# Patient Record
Sex: Male | Born: 2000 | Race: Black or African American | Hispanic: No | Marital: Single | Smoking: Never smoker
Health system: Southern US, Community
[De-identification: ages and names within clinical notes are randomized; demographics above are authoritative.]

## PROBLEM LIST (undated history)

## (undated) DIAGNOSIS — R12 Heartburn: Secondary | ICD-10-CM

## (undated) HISTORY — DX: Heartburn: R12

---
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
## 2019-05-26 ENCOUNTER — Emergency Department
Admission: EM | Admit: 2019-05-26 | Discharge: 2019-05-26 | Disposition: A | Discharge status: Home / Self Care | Payer: Medicaid Other | Attending: Emergency Medicine | Admitting: Emergency Medicine

## 2019-05-26 ENCOUNTER — Other Ambulatory Visit: Payer: Self-pay

## 2019-05-26 ENCOUNTER — Encounter: Payer: Self-pay | Admitting: Emergency Medicine

## 2019-05-26 DIAGNOSIS — J029 Acute pharyngitis, unspecified: Secondary | ICD-10-CM

## 2019-05-26 DIAGNOSIS — J020 Streptococcal pharyngitis: Secondary | ICD-10-CM | POA: Diagnosis not present

## 2019-05-26 LAB — GROUP A STREP BY PCR: Group A Strep by PCR: NOT DETECTED

## 2019-05-26 MED ORDER — LIDOCAINE HCL (PF) 1 % IJ SOLN
5.0000 mL | Freq: Once | INTRAMUSCULAR | Status: DC
  Filled 2019-05-26: qty 5

## 2019-05-26 MED ORDER — AMOXICILLIN 500 MG PO CAPS: 500.0000 mg | ORAL_CAPSULE | Freq: Three times a day (TID) | ORAL | 0 refills | Status: DC

## 2019-05-26 MED ORDER — LIDOCAINE VISCOUS HCL 2 % MT SOLN
15.0000 mL | Freq: Once | OROMUCOSAL | Status: AC
  Administered 2019-05-26: 15:00:00 15 mL via OROMUCOSAL
  Filled 2019-05-26: qty 15

## 2019-05-26 MED ORDER — AMOXICILLIN 500 MG PO CAPS
500.0000 mg | ORAL_CAPSULE | Freq: Once | ORAL | Status: AC
  Administered 2019-05-26: 16:00:00 500 mg via ORAL
  Filled 2019-05-26: qty 1

## 2019-05-26 NOTE — ED Notes (Signed)

## 2019-05-26 NOTE — Discharge Instructions (Addendum)
You are being treated for strep throat. Take the prescription antibiotic as directed. Continue to take OTC Tylenol and Motrin as needed. Rest, hydrate, and change your toothbrush in 24 hours.

## 2019-05-26 NOTE — ED Triage Notes (Signed)
Patient reports sore throat and head congestion x2 days.

## 2019-05-26 NOTE — ED Provider Notes (Signed)
[facility] Emergency Department Provider Note ____________________________________________  Time seen: 1435  I have reviewed the triage vital signs and the nursing notes.  HISTORY  Chief Complaint  Sore Throat and Nasal Congestion  HPI Todd Oneill is a 18 y.o. male presents to the ED for evaluation of sudden onset of sore throat. He reports chills and subjective fevers. He reports pain with swallowing. He denies nausea, vomiting, or rash.    History reviewed. No pertinent past medical history.  There are no active problems to display for this patient.  History reviewed. No pertinent surgical history.  Prior to Admission medications   Medication Sig Start Date End Date Taking? Authorizing Provider  amoxicillin (AMOXIL) 500 MG capsule Take 1 capsule (500 mg total) by mouth 3 (three) times daily. 05/26/19   Eddison Searls, Charlesetta IvoryJenise V Bacon, PA-C    Allergies Patient has no known allergies.  No family history on file.  Social History Social History   Tobacco Use  . Smoking status: Never Smoker  . Smokeless tobacco: Never Used  Substance Use Topics  . Alcohol use: Never    Frequency: Never  . Drug use: Never    Review of Systems  Constitutional: Negative for fever. Eyes: Negative for visual changes. ENT: Positive for sore throat. Cardiovascular: Negative for chest pain. Respiratory: Negative for shortness of breath. Gastrointestinal: Negative for abdominal pain, vomiting and diarrhea. Genitourinary: Negative for dysuria. Musculoskeletal: Negative for back pain. Skin: Negative for rash. Neurological: Negative for headaches, focal weakness or numbness. ____________________________________________  PHYSICAL EXAM:  VITAL SIGNS: ED Triage Vitals  Enc Vitals Group     BP 05/26/19 1324 140/85     Pulse Rate 05/26/19 1324 83     Resp 05/26/19 1324 16     Temp 05/26/19 1324 100 F (37.8 C)     Temp Source 05/26/19 1324 Oral     SpO2 05/26/19 1324  100 %     Weight 05/26/19 1325 130 lb (59 kg)     Height --      Head Circumference --      Peak Flow --      Pain Score 05/26/19 1325 8     Pain Loc --      Pain Edu? --      Excl. in GC? --     Constitutional: Alert and oriented. Well appearing and in no distress. Head: Normocephalic and atraumatic. Eyes: Conjunctivae are normal. Normal extraocular movements Mouth/Throat: Mucous membranes are moist. Uvula is midline and tonsils are mildly injected.  Neck: Supple. No thyromegaly. Hematological/Lymphatic/Immunological: Palpable, tender anterior cervical lymphadenopathy. Cardiovascular: Normal rate, regular rhythm. Normal distal pulses. Respiratory: Normal respiratory effort. No wheezes/rales/rhonchi. Gastrointestinal: Soft and nontender. No distention. Musculoskeletal: Nontender with normal range of motion in all extremities.  Neurologic:  Normal gait without ataxia. Normal speech and language. No gross focal neurologic deficits are appreciated. Skin:  Skin is warm, dry and intact. No rash noted. ____________________________________________   LABS (pertinent positives/negatives) Labs Reviewed  GROUP A STREP BY PCR  ____________________________________________  PROCEDURES  Viscous lido 2% gargle Amoxicillin 500 mg PO Procedures ____________________________________________  INITIAL IMPRESSION / ASSESSMENT AND PLAN / ED COURSE  Todd Oneill to the ED for evaluation of sudden sore throat pain.  Patient presents with clinical picture that is concerning for strep pharyngitis.  He has a elevated temp at 100 F.  He also has palpable anterior lymph nodes.  Patient be treated empirically for strep pharyngitis despite his negative rapid test.  A  prescription for amoxicillin is provided at this time.  He will follow with primary provider return to the ED as needed.  Todd MATSUO was evaluated in Emergency Department on 05/26/2019 for the symptoms described in the history of present illness.  He was evaluated in the context of the global COVID-19 pandemic, which necessitated consideration that the patient might be at risk for infection with the SARS-CoV-2 virus that causes COVID-19. Institutional protocols and algorithms that pertain to the evaluation of patients at risk for COVID-19 are in a state of rapid change based on information released by regulatory bodies including the CDC and federal and state organizations. These policies and algorithms were followed during the patient's care in the ED. ____________________________________________  FINAL CLINICAL IMPRESSION(S) / ED DIAGNOSES  Final diagnoses:  Strep pharyngitis  Sore throat      Todd Oneill, Todd Karvonen, PA-C 05/26/19 1702    Lavonia Drafts, MD 05/27/19 1008

## 2019-07-15 ENCOUNTER — Other Ambulatory Visit: Payer: Self-pay

## 2019-07-15 ENCOUNTER — Encounter: Payer: Self-pay | Admitting: Family Medicine

## 2019-07-15 ENCOUNTER — Ambulatory Visit: Payer: Medicaid Other | Admitting: Family Medicine

## 2019-07-15 DIAGNOSIS — Z202 Contact with and (suspected) exposure to infections with a predominantly sexual mode of transmission: Secondary | ICD-10-CM | POA: Diagnosis not present

## 2019-07-15 DIAGNOSIS — N341 Nonspecific urethritis: Secondary | ICD-10-CM | POA: Diagnosis not present

## 2019-07-15 DIAGNOSIS — R4586 Emotional lability: Secondary | ICD-10-CM

## 2019-07-15 DIAGNOSIS — Z113 Encounter for screening for infections with a predominantly sexual mode of transmission: Secondary | ICD-10-CM

## 2019-07-15 DIAGNOSIS — Z7252 High risk homosexual behavior: Secondary | ICD-10-CM

## 2019-07-15 LAB — GRAM STAIN

## 2019-07-15 MED ORDER — PENICILLIN G BENZATHINE 2400000 UNIT/4ML IM SUSP
2.4000 10*6.[IU] | Freq: Once | INTRAMUSCULAR | Status: AC
  Administered 2019-07-15: 2400000 [IU] via INTRAMUSCULAR

## 2019-07-15 MED ORDER — AZITHROMYCIN 500 MG PO TABS
1000.0000 mg | ORAL_TABLET | Freq: Once | ORAL | Status: AC
  Administered 2019-07-15: 1000 mg via ORAL

## 2019-07-15 NOTE — Progress Notes (Signed)
Bicillin 2.81mu given in 2 divided doses. Bicillin 1.62mu IM given in right gluteal by this RN. Pt tolerated well.Ronny Bacon, RN

## 2019-07-15 NOTE — Progress Notes (Signed)
STI clinic/screening visit  Subjective:  Todd Oneill is a 18 y.o. male being seen today for an STI screening visit. The patient reports they do not have symptoms.  Patient has the following medical conditions:  There are no active problems to display for this patient.    Chief Complaint  Patient presents with  . Exposure to STD    HPI  Patient reports contact to syphilis   See flowsheet for further details and programmatic requirements.    The following portions of the patient's history were reviewed and updated as appropriate: allergies, current medications, past medical history, past social history, past surgical history and problem list.  Objective:  There were no vitals filed for this visit.  Physical Exam Constitutional:      Appearance: Normal appearance.  HENT:     Head: Normocephalic and atraumatic.     Comments: No nits or hair loss    Mouth/Throat:     Mouth: Mucous membranes are moist.     Pharynx: Oropharynx is clear. No oropharyngeal exudate or posterior oropharyngeal erythema.  Pulmonary:     Effort: Pulmonary effort is normal.  Abdominal:     General: Abdomen is flat.     Palpations: Abdomen is soft. There is no hepatomegaly or mass.     Tenderness: There is no abdominal tenderness.  Genitourinary:    Pubic Area: No rash or pubic lice.      Penis: Normal.      Scrotum/Testes: Normal.     Epididymis:     Right: Normal.     Left: Normal.     Rectum: Normal.  Lymphadenopathy:     Head:     Right side of head: No preauricular or posterior auricular adenopathy.     Left side of head: No preauricular or posterior auricular adenopathy.     Cervical: No cervical adenopathy.     Upper Body:     Right upper body: No supraclavicular or axillary adenopathy.     Left upper body: No supraclavicular or axillary adenopathy.     Lower Body: No right inguinal adenopathy. No left inguinal adenopathy.  Skin:    General: Skin is warm and dry.     Findings: No  rash.  Neurological:     Mental Status: He is alert and oriented to person, place, and time.       Assessment and Plan:  Todd Oneill is a 18 y.o. male presenting to the [facility] Department for STI screening  1. Screening examination for venereal disease Treat gram stain per standing Accepts all screenings today - Gram stain - HIV/HCV [place] [facility] - HBV Antigen/Antibody [place] [facility] - Chlamydia/Gonorrhea [place] [facility] - Syphilis Serology, [place] [facility] - [place] [facility]  2. High risk homosexual behavior - HIV/HCV [place] [facility] - HBV Antigen/Antibody [place] [facility] - Chlamydia/Gonorrhea [place] [facility] - Syphilis Serology, [place] [facility] - [place] [facility]  3. Syphilis contact Partner with report to syphilis. Will treat today and await results. Will consider late latent as patient had never testing prior.  - Penicillin G Benzathine SUSP 2,400,000 Units  Return in 1 week (on 07/22/2019) for PCN.  Future Appointments  Date Time Provider [place]  07/22/2019  2:00 PM [facility] NURSE [facility] None    Caren Macadam, MD

## 2019-07-15 NOTE — Progress Notes (Signed)
Gram stain reviewed. Tx'd for NGU per SO.  Patient also tx'd for contact to syphilis. Aileen Fass, RN

## 2019-07-22 ENCOUNTER — Other Ambulatory Visit: Payer: Self-pay

## 2019-07-22 ENCOUNTER — Ambulatory Visit: Payer: Self-pay

## 2019-07-22 DIAGNOSIS — Z719 Counseling, unspecified: Secondary | ICD-10-CM

## 2019-07-22 NOTE — Progress Notes (Signed)
Client presents to Nurse Clinic to ascertain if additional Bicillin is needed. Evaluated 07/15/2019 in [facility] STD clinic and treated with Bicillin as contact to syphilis. As lab reports not yet received from [facility], consult with Antoine Primas PA. She reviewed labs on web site (no positives indicating need for treatment). As syphilis is negative and client treated last week, no additional Bicillin is needed per Ms Lazarus Salines.. Client counseled regarding above. Rich Number, RN

## 2019-07-23 LAB — HEPATITIS B SURFACE ANTIGEN

## 2019-07-24 LAB — HM HIV SCREENING LAB: HM HIV Screening: NEGATIVE

## 2019-07-24 LAB — HM HEPATITIS C SCREENING LAB: HM Hepatitis Screen: NEGATIVE

## 2019-11-24 ENCOUNTER — Other Ambulatory Visit: Payer: Self-pay

## 2019-11-24 ENCOUNTER — Emergency Department
Admission: EM | Admit: 2019-11-24 | Discharge: 2019-11-24 | Disposition: A | Discharge status: Home / Self Care | Payer: Medicaid Other | Attending: Emergency Medicine | Admitting: Emergency Medicine

## 2019-11-24 ENCOUNTER — Encounter: Payer: Self-pay | Admitting: Emergency Medicine

## 2019-11-24 DIAGNOSIS — M7918 Myalgia, other site: Secondary | ICD-10-CM | POA: Insufficient documentation

## 2019-11-24 DIAGNOSIS — R109 Unspecified abdominal pain: Secondary | ICD-10-CM | POA: Insufficient documentation

## 2019-11-24 DIAGNOSIS — R509 Fever, unspecified: Secondary | ICD-10-CM | POA: Insufficient documentation

## 2019-11-24 DIAGNOSIS — J020 Streptococcal pharyngitis: Secondary | ICD-10-CM | POA: Insufficient documentation

## 2019-11-24 DIAGNOSIS — R070 Pain in throat: Secondary | ICD-10-CM | POA: Diagnosis present

## 2019-11-24 MED ORDER — AMOXICILLIN-POT CLAVULANATE 875-125 MG PO TABS: 1.0000 | ORAL_TABLET | Freq: Two times a day (BID) | ORAL | 0 refills | Status: AC

## 2019-11-24 NOTE — Discharge Instructions (Addendum)
Please take antibiotics as prescribed and make sure you are drinking lots of fluids. Please alternate tylenol and ibu as needed for fevers. Return to the ED for any difficulty swallowing, increased pain or for any urgent changes in your health

## 2019-11-24 NOTE — ED Triage Notes (Signed)
Pt in via POV, reports waking up this morning with sore throat, chills, some nausea.  Febrile upon arrival, other vitals WDL.  Ambulatory to triage, NAD noted at this time.

## 2019-11-24 NOTE — ED Provider Notes (Signed)
[facility] EMERGENCY DEPARTMENT Provider Note   CSN: [id number] Arrival date & time: 11/24/19  1624     History Chief Complaint  Patient presents with  . Fever  . Sore Throat    Todd Oneill is a 19 y.o. male presents to the emergency department for evaluation of sore throat.  Patient developed sore throat yesterday, mostly on the left side.  Has pain with swallowing.  Pain on a 10.  Has not had any medications such as Tylenol or ibuprofen.  He denies any nausea vomiting diarrhea or rashes.  Has had some mild abdominal pain and body aches.  Temperature upon arrival to ED 100.5.  Tolerating fluids well.  HPI     History reviewed. No pertinent past medical history.  There are no problems to display for this patient.   History reviewed. No pertinent surgical history.     No family history on file.  Social History   Tobacco Use  . Smoking status: Never Smoker  . Smokeless tobacco: Never Used  Substance Use Topics  . Alcohol use: Never  . Drug use: Never    Home Medications Prior to Admission medications   Medication Sig Start Date End Date Taking? Authorizing Provider  amoxicillin-clavulanate (AUGMENTIN) 875-125 MG tablet Take 1 tablet by mouth every 12 (twelve) hours for 10 days. 11/24/19 12/04/19  Evon Slack, PA-C    Allergies    Patient has no known allergies.  Review of Systems   Review of Systems  Constitutional: Positive for chills and fever.  HENT: Positive for sore throat.   Respiratory: Negative for shortness of breath.   Cardiovascular: Negative for chest pain.  Gastrointestinal: Negative for abdominal pain, diarrhea, nausea and vomiting.  Genitourinary: Negative for dysuria.  Musculoskeletal: Positive for myalgias.  Skin: Negative for rash.    Physical Exam Updated Vital Signs BP (!) 147/77 (BP Location: Left Arm)   Pulse (!) 111   Temp (!) 100.5 F (38.1 C) (Oral)   Resp 16   Ht 5\' 7"  (1.702 m)   Wt 54.4 kg    SpO2 98%   BMI 18.79 kg/m   Physical Exam Constitutional:      Appearance: He is well-developed.  HENT:     Head: Normocephalic and atraumatic.     Nose: No congestion or rhinorrhea.     Mouth/Throat:     Mouth: Mucous membranes are moist. No oral lesions.     Pharynx: Uvula midline. Oropharyngeal exudate and posterior oropharyngeal erythema present. No pharyngeal swelling or uvula swelling.     Tonsils: Tonsillar exudate present. No tonsillar abscesses. 1+ on the right. 1+ on the left.  Eyes:     Conjunctiva/sclera: Conjunctivae normal.  Cardiovascular:     Rate and Rhythm: Tachycardia present.  Pulmonary:     Effort: Pulmonary effort is normal. No respiratory distress.  Musculoskeletal:        General: Normal range of motion.     Cervical back: Normal range of motion.  Skin:    General: Skin is warm.     Findings: No rash.  Neurological:     Mental Status: He is alert and oriented to person, place, and time.  Psychiatric:        Behavior: Behavior normal.        Thought Content: Thought content normal.     ED Results / Procedures / Treatments   Labs (all labs ordered are listed, but only abnormal results are displayed) Labs Reviewed - No  data to display  EKG None  Radiology No results found.  Procedures Procedures (including critical care time)  Medications Ordered in ED Medications - No data to display  ED Course  I have reviewed the triage vital signs and the nursing notes.  Pertinent labs & imaging results that were available during my care of the patient were reviewed by me and considered in my medical decision making (see chart for details).    MDM Rules/Calculators/A&P                      19 year old male with strep pharyngitis.  He has exudates noted on the left tonsil with positive erythema.  No signs of peritonsillar abscess.  Tolerating p.o. well.  No coughing.  Patient educated on Tylenol and ibuprofen for pain and fevers.  He will take  Augmentin for 10 days.  He understands signs symptoms return to ED for. Final Clinical Impression(s) / ED Diagnoses Final diagnoses:  Strep pharyngitis  Fever, unspecified fever cause    Rx / DC Orders ED Discharge Orders         Ordered    amoxicillin-clavulanate (AUGMENTIN) 875-125 MG tablet  Every 12 hours     11/24/19 1704           Renata Caprice 11/24/19 Smith Mince, MD 11/25/19 1511

## 2019-11-24 NOTE — ED Notes (Signed)
AAOx3.  Skin warm and dry.   

## 2020-01-11 ENCOUNTER — Encounter: Payer: Self-pay | Admitting: Emergency Medicine

## 2020-01-11 ENCOUNTER — Other Ambulatory Visit: Payer: Self-pay

## 2020-01-11 DIAGNOSIS — J029 Acute pharyngitis, unspecified: Secondary | ICD-10-CM | POA: Diagnosis present

## 2020-01-11 DIAGNOSIS — J020 Streptococcal pharyngitis: Secondary | ICD-10-CM | POA: Insufficient documentation

## 2020-01-11 NOTE — ED Triage Notes (Signed)
Patient with complaint of sore throat that started this morning.

## 2020-01-12 ENCOUNTER — Emergency Department
Admission: EM | Admit: 2020-01-12 | Discharge: 2020-01-12 | Disposition: A | Discharge status: Home / Self Care | Payer: Medicaid Other | Attending: Emergency Medicine | Admitting: Emergency Medicine

## 2020-01-12 DIAGNOSIS — J020 Streptococcal pharyngitis: Secondary | ICD-10-CM

## 2020-01-12 LAB — GROUP A STREP BY PCR: Group A Strep by PCR: DETECTED — AB

## 2020-01-12 MED ORDER — AMOXICILLIN-POT CLAVULANATE 875-125 MG PO TABS: 1.0000 | ORAL_TABLET | Freq: Two times a day (BID) | ORAL | 0 refills | Status: AC

## 2020-01-12 MED ORDER — AMOXICILLIN-POT CLAVULANATE 875-125 MG PO TABS
1.0000 | ORAL_TABLET | Freq: Once | ORAL | Status: AC
  Administered 2020-01-12: 01:00:00 1 via ORAL
  Filled 2020-01-12: qty 1

## 2020-01-12 NOTE — ED Provider Notes (Signed)
[facility] Emergency Department Provider Note  ____________________________________________   First MD Initiated Contact with Patient 01/12/20 0109     (approximate)  I have reviewed the triage vital signs and the nursing notes.   HISTORY  Chief Complaint Sore Throat   HPI Todd Oneill is a 19 y.o. male with history of strep throat in the past presents to the emergency department secondary to acute onset of sore throat which began this morning.  Patient denies any fever.  Patient denies any cough.        History reviewed. No pertinent past medical history.  There are no problems to display for this patient.   History reviewed. No pertinent surgical history.  Prior to Admission medications   Not on File    Allergies Patient has no known allergies.  No family history on file.  Social History Social History   Tobacco Use  . Smoking status: Never Smoker  . Smokeless tobacco: Never Used  Substance Use Topics  . Alcohol use: Never  . Drug use: Never    Review of Systems Constitutional: No fever/chills Eyes: No visual changes. ENT: Positive for sore throat. Cardiovascular: Denies chest pain. Respiratory: Denies shortness of breath. Gastrointestinal: No abdominal pain.  No nausea, no vomiting.  No diarrhea.  No constipation. Genitourinary: Negative for dysuria. Musculoskeletal: Negative for neck pain.  Negative for back pain. Integumentary: Negative for rash. Neurological: Negative for headaches, focal weakness or numbness.   ____________________________________________   PHYSICAL EXAM:  VITAL SIGNS: ED Triage Vitals  Enc Vitals Group     BP 01/11/20 2332 126/75     Pulse Rate 01/11/20 2332 64     Resp 01/11/20 2332 18     Temp 01/11/20 2332 98.3 F (36.8 C)     Temp Source 01/11/20 2332 Oral     SpO2 01/11/20 2332 99 %     Weight 01/11/20 2307 56.7 kg (125 lb)     Height 01/11/20 2307 1.702 m (5\' 7" )     Head  Circumference --      Peak Flow --      Pain Score 01/11/20 2307 7     Pain Loc --      Pain Edu? --      Excl. in [place]? --     Constitutional: Alert and oriented.  Eyes: Conjunctivae are normal.  Mouth/Throat: Pharyngeal erythema with scant exudate Neck: No stridor.  No meningeal signs.   Cardiovascular: Normal rate, regular rhythm. Good peripheral circulation. Grossly normal heart sounds. Respiratory: Normal respiratory effort.  No retractions. Gastrointestinal: Soft and nontender. No distention.  Musculoskeletal: No lower extremity tenderness nor edema. No gross deformities of extremities. Neurologic:  Normal speech and language. No gross focal neurologic deficits are appreciated.  Skin:  Skin is warm, dry and intact. Psychiatric: Mood and affect are normal. Speech and behavior are normal.  ____________________________________________   LABS (all labs ordered are listed, but only abnormal results are displayed)  Labs Reviewed  GROUP A STREP BY PCR - Abnormal; Notable for the following components:      Result Value   Group A Strep by PCR DETECTED (*)    All other components within normal limits    ______  Procedures   ____________________________________________   INITIAL IMPRESSION / MDM / ASSESSMENT AND PLAN / ED COURSE  As part of my medical decision making, I reviewed the following data within the electronic MEDICAL RECORD NUMBER   19 year old male presented with above-stated history  and physical exam consistent with strep pharyngitis which was confirmed on swab.  Patient was prescribed Augmentin for home. ____________________________________________  FINAL CLINICAL IMPRESSION(S) / ED DIAGNOSES  Final diagnoses:  Strep pharyngitis     MEDICATIONS GIVEN DURING THIS VISIT:  Medications - No data to display   ED Discharge Orders    None      *Please note:  Todd Oneill was evaluated in Emergency Department on 01/12/2020 for the symptoms described in the  history of present illness. He was evaluated in the context of the global COVID-19 pandemic, which necessitated consideration that the patient might be at risk for infection with the SARS-CoV-2 virus that causes COVID-19. Institutional protocols and algorithms that pertain to the evaluation of patients at risk for COVID-19 are in a state of rapid change based on information released by regulatory bodies including the CDC and federal and state organizations. These policies and algorithms were followed during the patient's care in the ED.  Some ED evaluations and interventions may be delayed as a result of limited staffing during the pandemic.*  Note:  This document was prepared using Dragon voice recognition software and may include unintentional dictation errors.   Darci Current, MD 01/12/20 [phone]

## 2021-05-19 ENCOUNTER — Other Ambulatory Visit: Payer: Self-pay

## 2021-05-19 ENCOUNTER — Emergency Department: Payer: Medicaid Other | Admitting: Anesthesiology

## 2021-05-19 ENCOUNTER — Emergency Department: Payer: Medicaid Other

## 2021-05-19 ENCOUNTER — Observation Stay
Admission: EM | Admit: 2021-05-19 | Discharge: 2021-05-20 | Disposition: A | Discharge status: Home / Self Care | Payer: Medicaid Other | Attending: Surgery | Admitting: Surgery

## 2021-05-19 ENCOUNTER — Encounter
Admission: EM | Disposition: A | Discharge status: Home / Self Care | Payer: Self-pay | Source: Home / Self Care | Attending: Student in an Organized Health Care Education/Training Program

## 2021-05-19 DIAGNOSIS — K610 Anal abscess: Secondary | ICD-10-CM | POA: Diagnosis not present

## 2021-05-19 DIAGNOSIS — Z20822 Contact with and (suspected) exposure to covid-19: Secondary | ICD-10-CM | POA: Diagnosis not present

## 2021-05-19 DIAGNOSIS — K6289 Other specified diseases of anus and rectum: Secondary | ICD-10-CM | POA: Diagnosis present

## 2021-05-19 HISTORY — PX: INCISION AND DRAINAGE ABSCESS: SHX5864

## 2021-05-19 LAB — CBC WITH DIFFERENTIAL/PLATELET
Abs Immature Granulocytes: 0.02 10*3/uL (ref 0.00–0.07)
Basophils Absolute: 0 10*3/uL (ref 0.0–0.1)
Basophils Relative: 0 %
Eosinophils Absolute: 0 10*3/uL (ref 0.0–0.5)
Eosinophils Relative: 0 %
HCT: 39.2 % (ref 39.0–52.0)
Hemoglobin: 13.6 g/dL (ref 13.0–17.0)
Immature Granulocytes: 0 %
Lymphocytes Relative: 18 %
Lymphs Abs: 1.6 10*3/uL (ref 0.7–4.0)
MCH: 30.6 pg (ref 26.0–34.0)
MCHC: 34.7 g/dL (ref 30.0–36.0)
MCV: 88.3 fL (ref 80.0–100.0)
Monocytes Absolute: 0.8 10*3/uL (ref 0.1–1.0)
Monocytes Relative: 9 %
Neutro Abs: 6.5 10*3/uL (ref 1.7–7.7)
Neutrophils Relative %: 73 %
Platelets: 241 10*3/uL (ref 150–400)
RBC: 4.44 MIL/uL (ref 4.22–5.81)
RDW: 11.9 % (ref 11.5–15.5)
WBC: 9 10*3/uL (ref 4.0–10.5)
nRBC: 0 % (ref 0.0–0.2)

## 2021-05-19 LAB — COMPREHENSIVE METABOLIC PANEL
ALT: 12 U/L (ref 0–44)
AST: 16 U/L (ref 15–41)
Albumin: 4.2 g/dL (ref 3.5–5.0)
Alkaline Phosphatase: 43 U/L (ref 38–126)
Anion gap: 9 (ref 5–15)
BUN: 8 mg/dL (ref 6–20)
CO2: 24 mmol/L (ref 22–32)
Calcium: 9.4 mg/dL (ref 8.9–10.3)
Chloride: 104 mmol/L (ref 98–111)
Creatinine, Ser: 0.86 mg/dL (ref 0.61–1.24)
GFR, Estimated: >60 mL/min (ref >60)
Glucose, Bld: 89 mg/dL (ref 70–99)
Potassium: 3.6 mmol/L (ref 3.5–5.1)
Sodium: 137 mmol/L (ref 135–145)
Total Bilirubin: 0.7 mg/dL (ref 0.3–1.2)
Total Protein: 7.4 g/dL (ref 6.5–8.1)

## 2021-05-19 LAB — RESP PANEL BY RT-PCR (FLU A&B, COVID) ARPGX2
Influenza A by PCR: NEGATIVE
Influenza B by PCR: NEGATIVE
SARS Coronavirus 2 by RT PCR: NEGATIVE

## 2021-05-19 SURGERY — EXAM UNDER ANESTHESIA
Anesthesia: General | Site: Rectum

## 2021-05-19 MED ORDER — ONDANSETRON 4 MG PO TBDP: 4.0000 mg | ORAL_TABLET | Freq: Four times a day (QID) | ORAL | Status: DC | PRN

## 2021-05-19 MED ORDER — OXYCODONE HCL 5 MG PO TABS
5.0000 mg | ORAL_TABLET | Freq: Once | ORAL | Status: AC | PRN
  Administered 2021-05-19: 5 mg via ORAL

## 2021-05-19 MED ORDER — MIDAZOLAM HCL 2 MG/2ML IJ SOLN
INTRAMUSCULAR | Status: AC
  Filled 2021-05-19: qty 2

## 2021-05-19 MED ORDER — 0.9 % SODIUM CHLORIDE (POUR BTL) OPTIME
TOPICAL | Status: DC | PRN
  Administered 2021-05-19: 20 mL

## 2021-05-19 MED ORDER — LIDOCAINE HCL (CARDIAC) PF 100 MG/5ML IV SOSY
PREFILLED_SYRINGE | INTRAVENOUS | Status: DC | PRN
  Administered 2021-05-19: 50 mg via INTRAVENOUS

## 2021-05-19 MED ORDER — PROPOFOL 10 MG/ML IV BOLUS
INTRAVENOUS | Status: AC
  Filled 2021-05-19: qty 40

## 2021-05-19 MED ORDER — IOHEXOL 350 MG/ML SOLN
80.0000 mL | Freq: Once | INTRAVENOUS | Status: AC | PRN
  Administered 2021-05-19: 80 mL via INTRAVENOUS
  Filled 2021-05-19: qty 80

## 2021-05-19 MED ORDER — TRAMADOL HCL 50 MG PO TABS: 50.0000 mg | ORAL_TABLET | Freq: Four times a day (QID) | ORAL | Status: DC | PRN

## 2021-05-19 MED ORDER — FENTANYL CITRATE (PF) 100 MCG/2ML IJ SOLN
INTRAMUSCULAR | Status: DC | PRN
  Administered 2021-05-19 (×2): 50 ug via INTRAVENOUS

## 2021-05-19 MED ORDER — PROPOFOL 10 MG/ML IV BOLUS
INTRAVENOUS | Status: DC | PRN
  Administered 2021-05-19: 200 mg via INTRAVENOUS

## 2021-05-19 MED ORDER — SUCCINYLCHOLINE CHLORIDE 200 MG/10ML IV SOSY
PREFILLED_SYRINGE | INTRAVENOUS | Status: DC | PRN
  Administered 2021-05-19: 100 mg via INTRAVENOUS

## 2021-05-19 MED ORDER — ONDANSETRON HCL 4 MG/2ML IJ SOLN: 4.0000 mg | Freq: Four times a day (QID) | INTRAMUSCULAR | Status: DC | PRN

## 2021-05-19 MED ORDER — DEXMEDETOMIDINE (PRECEDEX) IN NS 20 MCG/5ML (4 MCG/ML) IV SYRINGE
PREFILLED_SYRINGE | INTRAVENOUS | Status: AC
  Filled 2021-05-19: qty 5

## 2021-05-19 MED ORDER — ACETAMINOPHEN 325 MG PO TABS: 650.0000 mg | ORAL_TABLET | Freq: Four times a day (QID) | ORAL | Status: DC | PRN

## 2021-05-19 MED ORDER — BUPIVACAINE LIPOSOME 1.3 % IJ SUSP
INTRAMUSCULAR | Status: AC
  Filled 2021-05-19: qty 20

## 2021-05-19 MED ORDER — PROMETHAZINE HCL 25 MG/ML IJ SOLN
6.2500 mg | INTRAMUSCULAR | Status: DC | PRN
Start: 2021-05-19 — End: 2021-05-19

## 2021-05-19 MED ORDER — BUPIVACAINE LIPOSOME 1.3 % IJ SUSP
INTRAMUSCULAR | Status: DC | PRN
  Administered 2021-05-19: 20 mL

## 2021-05-19 MED ORDER — BUPIVACAINE HCL 0.5 % IJ SOLN
INTRAMUSCULAR | Status: DC | PRN
  Administered 2021-05-19: 30 mL

## 2021-05-19 MED ORDER — ONDANSETRON HCL 4 MG/2ML IJ SOLN
INTRAMUSCULAR | Status: AC
  Filled 2021-05-19: qty 2

## 2021-05-19 MED ORDER — MORPHINE SULFATE (PF) 2 MG/ML IV SOLN: 1.0000 mg | INTRAVENOUS | Status: DC | PRN

## 2021-05-19 MED ORDER — SODIUM CHLORIDE 0.9 % IV SOLN
INTRAVENOUS | Status: DC | PRN
  Administered 2021-05-19: 2 g via INTRAVENOUS

## 2021-05-19 MED ORDER — ONDANSETRON HCL 4 MG/2ML IJ SOLN
INTRAMUSCULAR | Status: DC | PRN
  Administered 2021-05-19: 4 mg via INTRAVENOUS

## 2021-05-19 MED ORDER — GELATIN ABSORBABLE 12-7 MM EX MISC
CUTANEOUS | Status: DC | PRN
Start: 2021-05-19 — End: 2021-05-19
  Administered 2021-05-19: 1

## 2021-05-19 MED ORDER — MIDAZOLAM HCL 2 MG/2ML IJ SOLN
INTRAMUSCULAR | Status: DC | PRN
  Administered 2021-05-19: 2 mg via INTRAVENOUS

## 2021-05-19 MED ORDER — IBUPROFEN 400 MG PO TABS: 600.0000 mg | ORAL_TABLET | Freq: Four times a day (QID) | ORAL | Status: DC | PRN

## 2021-05-19 MED ORDER — BUPIVACAINE HCL (PF) 0.5 % IJ SOLN
INTRAMUSCULAR | Status: AC
  Filled 2021-05-19: qty 30

## 2021-05-19 MED ORDER — DEXAMETHASONE SODIUM PHOSPHATE 10 MG/ML IJ SOLN
INTRAMUSCULAR | Status: DC | PRN
  Administered 2021-05-19: 10 mg via INTRAVENOUS

## 2021-05-19 MED ORDER — DEXAMETHASONE SODIUM PHOSPHATE 10 MG/ML IJ SOLN
INTRAMUSCULAR | Status: AC
  Filled 2021-05-19: qty 1

## 2021-05-19 MED ORDER — OXYCODONE HCL 5 MG PO TABS: 5.0000 mg | ORAL_TABLET | ORAL | Status: DC | PRN

## 2021-05-19 MED ORDER — FENTANYL CITRATE (PF) 100 MCG/2ML IJ SOLN
INTRAMUSCULAR | Status: AC
  Filled 2021-05-19: qty 2

## 2021-05-19 MED ORDER — DEXMEDETOMIDINE (PRECEDEX) IN NS 20 MCG/5ML (4 MCG/ML) IV SYRINGE
PREFILLED_SYRINGE | INTRAVENOUS | Status: DC | PRN
  Administered 2021-05-19: 20 ug via INTRAVENOUS

## 2021-05-19 MED ORDER — SODIUM CHLORIDE 0.9 % IV SOLN
2.0000 g | Freq: Once | INTRAVENOUS | Status: DC
  Filled 2021-05-19 (×2): qty 2

## 2021-05-19 MED ORDER — LIDOCAINE HCL (PF) 2 % IJ SOLN
INTRAMUSCULAR | Status: AC
  Filled 2021-05-19: qty 5

## 2021-05-19 MED ORDER — OXYCODONE HCL 5 MG/5ML PO SOLN: 5.0000 mg | Freq: Once | ORAL | Status: AC | PRN

## 2021-05-19 MED ORDER — SUCCINYLCHOLINE CHLORIDE 200 MG/10ML IV SOSY
PREFILLED_SYRINGE | INTRAVENOUS | Status: AC
  Filled 2021-05-19: qty 10

## 2021-05-19 MED ORDER — FENTANYL CITRATE (PF) 100 MCG/2ML IJ SOLN: 25.0000 ug | INTRAMUSCULAR | Status: DC | PRN

## 2021-05-19 MED ORDER — OXYCODONE HCL 5 MG PO TABS
ORAL_TABLET | ORAL | Status: AC
  Filled 2021-05-19: qty 1

## 2021-05-19 MED ORDER — GELATIN ABSORBABLE 100 CM EX MISC
CUTANEOUS | Status: AC
  Filled 2021-05-19: qty 1

## 2021-05-19 MED ORDER — LACTATED RINGERS IV SOLN: INTRAVENOUS | Status: DC | PRN

## 2021-05-19 MED ORDER — ACETAMINOPHEN 10 MG/ML IV SOLN: 1000.0000 mg | Freq: Once | INTRAVENOUS | Status: DC | PRN

## 2021-05-19 MED ORDER — GLYCOPYRROLATE 0.2 MG/ML IJ SOLN
INTRAMUSCULAR | Status: AC
  Filled 2021-05-19: qty 1

## 2021-05-19 SURGICAL SUPPLY — 48 items
BLADE SURG 15 STRL LF DISP TIS (BLADE) ×1 IMPLANT
BLADE SURG 15 STRL SS (BLADE) ×1
BNDG GAUZE ELAST 4 BULKY (GAUZE/BANDAGES/DRESSINGS) IMPLANT
BRIEF STRETCH FOR OB PAD XXL (UNDERPADS AND DIAPERS) IMPLANT
CANISTER SUCT 1200ML W/VALVE (MISCELLANEOUS) IMPLANT
CHLORAPREP W/TINT 26 (MISCELLANEOUS) IMPLANT
DRAIN PENROSE 0.625X18 (DRAIN) IMPLANT
DRAIN PENROSE 12X.25 LTX STRL (MISCELLANEOUS) IMPLANT
DRAPE LAPAROTOMY 100X77 ABD (DRAPES) ×2 IMPLANT
DRAPE LAPAROTOMY 77X122 PED (DRAPES) ×2 IMPLANT
DRAPE PERI LITHO V/GYN (MISCELLANEOUS) ×2 IMPLANT
DRSG GAUZE FLUFF 36X18 (GAUZE/BANDAGES/DRESSINGS) ×2 IMPLANT
ELECT REM PT RETURN 9FT ADLT (ELECTROSURGICAL) ×2
ELECTRODE REM PT RTRN 9FT ADLT (ELECTROSURGICAL) ×1 IMPLANT
GAUZE 4X4 16PLY ~~LOC~~+RFID DBL (SPONGE) ×2 IMPLANT
GAUZE PACKING IODOFORM 1/2 (PACKING) IMPLANT
GAUZE PACKING IODOFORM 1X5 (PACKING) IMPLANT
GAUZE SPONGE 4X4 12PLY STRL (GAUZE/BANDAGES/DRESSINGS) IMPLANT
GLOVE SURG SYN 6.5 ES PF (GLOVE) ×6 IMPLANT
GLOVE SURG UNDER POLY LF SZ7 (GLOVE) ×4 IMPLANT
GOWN STRL REUS W/ TWL LRG LVL3 (GOWN DISPOSABLE) ×2 IMPLANT
GOWN STRL REUS W/TWL LRG LVL3 (GOWN DISPOSABLE) ×2
KIT TURNOVER CYSTO (KITS) ×2 IMPLANT
LABEL OR SOLS (LABEL) IMPLANT
MANIFOLD NEPTUNE II (INSTRUMENTS) ×2 IMPLANT
NDL SAFETY ECLIPSE 18X1.5 (NEEDLE) ×1 IMPLANT
NEEDLE HYPO 18GX1.5 SHARP (NEEDLE) ×1
NEEDLE HYPO 22GX1.5 SAFETY (NEEDLE) ×2 IMPLANT
NEEDLE HYPO 25X1 1.5 SAFETY (NEEDLE) ×2 IMPLANT
NS IRRIG 500ML POUR BTL (IV SOLUTION) ×2 IMPLANT
PACK BASIN MINOR ARMC (MISCELLANEOUS) ×2 IMPLANT
PAD ABD DERMACEA PRESS 5X9 (GAUZE/BANDAGES/DRESSINGS) IMPLANT
PAD PREP 24X41 OB/GYN DISP (PERSONAL CARE ITEMS) ×2 IMPLANT
SOL PREP PVP 2OZ (MISCELLANEOUS) ×2
SOLUTION PREP PVP 2OZ (MISCELLANEOUS) ×1 IMPLANT
SPONGE T-LAP 18X18 ~~LOC~~+RFID (SPONGE) ×2 IMPLANT
SURGILUBE 2OZ TUBE FLIPTOP (MISCELLANEOUS) ×2 IMPLANT
SUT CHROMIC 3 0 SH 27 (SUTURE) IMPLANT
SUT MNCRL 4-0 (SUTURE)
SUT MNCRL 4-0 27XMFL (SUTURE)
SUT VIC AB 2-0 CT2 27 (SUTURE) IMPLANT
SUT VIC AB 3-0 SH 27 (SUTURE)
SUT VIC AB 3-0 SH 27X BRD (SUTURE) IMPLANT
SUTURE MNCRL 4-0 27XMF (SUTURE) IMPLANT
SYR 10ML LL (SYRINGE) ×4 IMPLANT
SYR BULB IRRIG 60ML STRL (SYRINGE) ×2 IMPLANT
TOWEL OR 17X26 4PK STRL BLUE (TOWEL DISPOSABLE) ×2 IMPLANT
WATER STERILE IRR 500ML POUR (IV SOLUTION) IMPLANT

## 2021-05-19 NOTE — H&P (Signed)
Subjective:   CC: perianal abscess  HPI:  Todd Oneill is a 20 y.o. male who was consulted by Junita Push for evaluation of above.  First noted several days ago.  Symptoms include: Pain is sharp, perianal, worsening.  Exacerbated by pressure.  Alleviated by nothing specific.  Associated with swelling in the area, increasing in size.  Hx of anal intercourse a couple weeks ago     Past Medical History: none reported  Past Surgical History: none reported  Family History: none reported  Social History:  reports that he has never smoked. He has never used smokeless tobacco. He reports that he does not drink alcohol and does not use drugs.  Current Medications:  Prior to Admission medications   Not on File    Allergies:  No Known Allergies  ROS:  General: Denies weight loss, weight gain, fatigue, fevers, chills, and night sweats. Eyes: Denies blurry vision, double vision, eye pain, itchy eyes, and tearing. Ears: Denies hearing loss, earache, and ringing in ears. Nose: Denies sinus pain, congestion, infections, runny nose, and nosebleeds. Mouth/throat: Denies hoarseness, sore throat, bleeding gums, and difficulty swallowing. Heart: Denies chest pain, palpitations, racing heart, irregular heartbeat, leg pain or swelling, and decreased activity tolerance. Respiratory: Denies breathing difficulty, shortness of breath, wheezing, cough, and sputum. GI: Denies change in appetite, heartburn, nausea, vomiting, constipation, diarrhea, and blood in stool. GU: Denies difficulty urinating, pain with urinating, urgency, frequency, blood in urine. Musculoskeletal: Denies joint stiffness, pain, swelling, muscle weakness. Skin: Denies rash, itching, mass, tumors, sores, and boils Neurologic: Denies headache, fainting, dizziness, seizures, numbness, and tingling. Psychiatric: Denies depression, anxiety, difficulty sleeping, and memory loss. Endocrine: Denies heat or cold intolerance, and increased  thirst or urination. Blood/lymph: Denies easy bruising, easy bruising, and swollen glands     Objective:     BP (!) 146/94 (BP Location: Left Arm)   Pulse (!) 54   Temp 99.3 F (37.4 C) (Oral)   Resp 17   Ht 5\' 7"  (1.702 m)   Wt 59 kg   SpO2 97%   BMI 20.36 kg/m   Constitutional :  alert, cooperative, appears stated age, and no distress  Lymphatics/Throat:  no asymmetry, masses, or scars  Respiratory:  clear to auscultation bilaterally  Cardiovascular:  regular rate and rhythm  Gastrointestinal: soft, non-tender; bowel sounds normal; no masses,  no organomegaly.  Musculoskeletal: Steady gait and movement  Skin: Cool and moist  Psychiatric: Normal affect, non-agitated, not confused  Rectal: Deferred due to patient discomfort    LABS:  CMP Latest Ref Rng & Units 05/19/2021  Glucose 70 - 99 mg/dL 89  BUN 6 - 20 mg/dL 8  Creatinine 05/21/2021 - 2.84 mg/dL 1.32  Sodium 4.40 - 102 mmol/L 137  Potassium 3.5 - 5.1 mmol/L 3.6  Chloride 98 - 111 mmol/L 104  CO2 22 - 32 mmol/L 24  Calcium 8.9 - 10.3 mg/dL 9.4  Total Protein 6.5 - 8.1 g/dL 7.4  Total Bilirubin 0.3 - 1.2 mg/dL 0.7  Alkaline Phos 38 - 126 U/L 43  AST 15 - 41 U/L 16  ALT 0 - 44 U/L 12   CBC Latest Ref Rng & Units 05/19/2021  WBC 4.0 - 10.5 K/uL 9.0  Hemoglobin 13.0 - 17.0 g/dL 05/21/2021  Hematocrit 36.6 - 52.0 % 39.2  Platelets 150 - 400 K/uL 241    RADS: CLINICAL DATA:  Abscess, anal or rectal anal sex 3 weeks ago, onset of pain 2 weeks ago with increasing pain; pain most  at 8-11 position   EXAM: CT PELVIS WITH CONTRAST   TECHNIQUE: Multidetector CT imaging of the pelvis was performed using the standard protocol following the bolus administration of intravenous contrast.   CONTRAST:  60mL OMNIPAQUE IOHEXOL 350 MG/ML SOLN   COMPARISON:  None.   FINDINGS: Urinary Tract:  Unremarkable urinary bladder.   Bowel: Small left perianal collection measures 12 x 6 x 11 mm, series 2, image 41. There is a questionable  small 9 x 5 mm collection in the right perineum, series 2, image 43. Mild perirectal edema and rectal wall thickening. Remaining pelvic bowel loops are unremarkable. Appendix is visualized and normal.   Vascular/Lymphatic: Few prominent inguinal nodes are likely reactive. Patent pelvic vasculature.   Reproductive:  Unremarkable prostate gland.   Other: Small perianal collections as described above. Mild soft tissue thickening extends into the posterior perineum. No additional perineal fluid collection. No soft tissue air. There is trace free fluid in the pelvis that is likely reactive. No intrapelvic abscess. No inguinal hernia.   Musculoskeletal: There are no acute or suspicious osseous abnormalities.   IMPRESSION: 1. Small left perianal abscess measuring 12 x 6 x 11 mm. There is a questionable small 9 x 5 mm collection in the right perineum. 2. Mild perirectal edema and rectal wall thickening.     Electronically Signed   By: Narda Rutherford M.D.   On: 05/19/2021 18:49    Assessment:     Perianal abscess, pain intolarable so will proceed with urgent I&D.   Plan:     1.Discussed risks/benefits/alternatives to surgery.  Alternatives include the options of observation, medical management.  Benefits include symptomatic relief.  I discussed  in detail and the complications related to the operation and the anesthesia, including bleeding, infection, recurrence, remote possibility of temporary or permanent fecal incontinence, poor/delayed wound healing, chronic pain, and additional procedures to address said risks. The risks of general anesthetic, if used, includes MI, CVA, sudden death or even reaction to anesthetic medications also discussed.   We also discussed typical post operative recovery which includes weeks to potentially months of anal pain, drainage, occasional bleeding, and sense of fecal urgency.    The patient understands the risks, any and all questions were answered  to the patient's satisfaction. Still agreeable to proceed

## 2021-05-19 NOTE — ED Provider Notes (Signed)
[facility] Emergency Department Provider Note  ____________________________________________   Event Date/Time   First MD Initiated Contact with Patient 05/19/21 1642     (approximate)  I have reviewed the triage vital signs and the nursing notes.   HISTORY  Chief Complaint Abscess   HPI Todd Oneill is a 20 y.o. male who presents to the emergency department for evaluation of rectal pain that has been present for the last 2 weeks, worsening in pain.  Patient feels like he has a lump that is increasing in size.  He does report being on the receiving end of anal sex approximately 3 weeks ago.  States that the intercourse itself was not painful, did not have pain for 1 week and then had sudden onset of pain to the left side.  He denies any rectal or anal discharge, fevers, chills or other systemic symptoms.  He denies any history of similar.        History reviewed. No pertinent past medical history.  There are no problems to display for this patient.   History reviewed. No pertinent surgical history.  Prior to Admission medications   Not on File    Allergies Patient has no known allergies.  No family history on file.  Social History Social History   Tobacco Use   Smoking status: Never   Smokeless tobacco: Never  Vaping Use   Vaping Use: Never used  Substance Use Topics   Alcohol use: Never   Drug use: Never    Review of Systems Constitutional: No fever/chills Eyes: No visual changes. ENT: No sore throat. Cardiovascular: Denies chest pain. Respiratory: Denies shortness of breath. Gastrointestinal: No abdominal pain.  No nausea, no vomiting.  No diarrhea.  No constipation. + Rectal pain Genitourinary: Negative for dysuria. Musculoskeletal: Negative for back pain. Skin: Negative for rash. Neurological: Negative for headaches, focal weakness or numbness.  ____________________________________________   PHYSICAL EXAM:  VITAL  SIGNS: ED Triage Vitals  Enc Vitals Group     BP 05/19/21 1506 (!) 165/90     Pulse Rate 05/19/21 1506 87     Resp 05/19/21 1506 18     Temp 05/19/21 1506 98.6 F (37 C)     Temp Source 05/19/21 1506 Oral     SpO2 05/19/21 1506 98 %     Weight 05/19/21 1507 130 lb (59 kg)     Height 05/19/21 1507 5\' 7"  (1.702 m)     Head Circumference --      Peak Flow --      Pain Score 05/19/21 1506 8     Pain Loc --      Pain Edu? --      Excl. in [place]? --     Constitutional: Alert and oriented. Well appearing and in no acute distress. Eyes: Conjunctivae are normal. PERRL. EOMI. Head: Atraumatic. Nose: No congestion/rhinnorhea. Mouth/Throat: Mucous membranes are moist.  Oropharynx non-erythematous. Neck: No stridor.   Cardiovascular: Normal rate, regular rhythm. Grossly normal heart sounds.  Good peripheral circulation. Respiratory: Normal respiratory effort.  No retractions. Lungs CTAB. Gastrointestinal: Soft and nontender. No distention. No abdominal bruits. No CVA tenderness.  Rectal exam with chaperone reveals no abnormal external region.  No erythema, induration, fluctuance or evidence of external abscess.  Internal rectal exam is quite tender on the left side in the 8-11 position.  The patient jumps due to the amount of pain and is unable to tolerate the remainder of the rectal exam. Musculoskeletal: No lower extremity  tenderness nor edema.  No joint effusions. Neurologic:  Normal speech and language. No gross focal neurologic deficits are appreciated. No gait instability. Skin:  Skin is warm, dry and intact. No rash noted. Psychiatric: Mood and affect are normal. Speech and behavior are normal.  ____________________________________________  RADIOLOGY  Official radiology report(s): CT PELVIS W CONTRAST  Result Date: 05/19/2021 CLINICAL DATA:  Abscess, anal or rectal anal sex 3 weeks ago, onset of pain 2 weeks ago with increasing pain; pain most at 8-11 position EXAM: CT PELVIS WITH  CONTRAST TECHNIQUE: Multidetector CT imaging of the pelvis was performed using the standard protocol following the bolus administration of intravenous contrast. CONTRAST:  46mL OMNIPAQUE IOHEXOL 350 MG/ML SOLN COMPARISON:  None. FINDINGS: Urinary Tract:  Unremarkable urinary bladder. Bowel: Small left perianal collection measures 12 x 6 x 11 mm, series 2, image 41. There is a questionable small 9 x 5 mm collection in the right perineum, series 2, image 43. Mild perirectal edema and rectal wall thickening. Remaining pelvic bowel loops are unremarkable. Appendix is visualized and normal. Vascular/Lymphatic: Few prominent inguinal nodes are likely reactive. Patent pelvic vasculature. Reproductive:  Unremarkable prostate gland. Other: Small perianal collections as described above. Mild soft tissue thickening extends into the posterior perineum. No additional perineal fluid collection. No soft tissue air. There is trace free fluid in the pelvis that is likely reactive. No intrapelvic abscess. No inguinal hernia. Musculoskeletal: There are no acute or suspicious osseous abnormalities. IMPRESSION: 1. Small left perianal abscess measuring 12 x 6 x 11 mm. There is a questionable small 9 x 5 mm collection in the right perineum. 2. Mild perirectal edema and rectal wall thickening. Electronically Signed   By: Narda Rutherford M.D.   On: 05/19/2021 18:49      ____________________________________________   INITIAL IMPRESSION / ASSESSMENT AND PLAN / ED COURSE  As part of my medical decision making, I reviewed the following data within the electronic MEDICAL RECORD NUMBER Nursing notes reviewed and incorporated, Labs reviewed , A consult was requested and obtained from this/these consultant(s) Surgery, and Notes from prior ED visits        Patient is a 20 year old male who presents to the emergency department for evaluation of rectal pain that is been worsening over the last 2 weeks.  He denies fever or other associated  symptoms.  This is in the context of anal sex received 3 weeks ago.  See HPI for further details.  In triage patient has grossly normal vital signs.  Physical exam as above.  The patient has quite a bit of rectal pain and tenderness.  Given his reported recent rectal trauma, increasing pain, will obtain labs and CT imaging.  Labs are grossly unremarkable with no leukocytosis or other abnormal finding.  CT with contrast of the pelvis does demonstrate left-sided perianal abscess and possible developing right sided abscess.  Case was discussed with on-call surgery Dr. Tonna Boehringer who will take the patient to the OR for exam and possible drainage.  The patient is stable at this time for transfer to the OR.      ____________________________________________   FINAL CLINICAL IMPRESSION(S) / ED DIAGNOSES  Final diagnoses:  Perianal abscess     ED Discharge Orders     None        Note:  This document was prepared using Dragon voice recognition software and may include unintentional dictation errors.    Lucy Chris, PA 05/19/21 2024    Willy Eddy, MD 05/23/21 [phone]

## 2021-05-19 NOTE — Anesthesia Preprocedure Evaluation (Addendum)
Anesthesia Evaluation  Patient identified by MRN, date of birth, ID band Patient awake    Reviewed: Allergy & Precautions, NPO status , Patient's Chart, lab work & pertinent test results  Airway Mallampati: II  TM Distance: >3 FB Neck ROM: Full    Dental no notable dental hx.    Pulmonary neg pulmonary ROS,    Pulmonary exam normal breath sounds clear to auscultation       Cardiovascular negative cardio ROS Normal cardiovascular exam Rhythm:Regular Rate:Normal     Neuro/Psych negative neurological ROS  negative psych ROS   GI/Hepatic negative GI ROS, Neg liver ROS,   Endo/Other  negative endocrine ROS  Renal/GU negative Renal ROS  negative genitourinary   Musculoskeletal negative musculoskeletal ROS (+)   Abdominal   Peds negative pediatric ROS (+)  Hematology negative hematology ROS (+)   Anesthesia Other Findings abscess-perirectal  Reproductive/Obstetrics negative OB ROS                            Anesthesia Physical Anesthesia Plan  ASA: 2  Anesthesia Plan: General   Post-op Pain Management:    Induction: Intravenous  PONV Risk Score and Plan: 3 and Ondansetron, Dexamethasone and Midazolam  Airway Management Planned: Oral ETT  Additional Equipment:   Intra-op Plan:   Post-operative Plan: Extubation in OR  Informed Consent: I have reviewed the patients History and Physical, chart, labs and discussed the procedure including the risks, benefits and alternatives for the proposed anesthesia with the patient or authorized representative who has indicated his/her understanding and acceptance.     Dental Advisory Given  Plan Discussed with: Anesthesiologist, CRNA and Surgeon  Anesthesia Plan Comments: (Patient consented for risks of anesthesia including but not limited to:  - adverse reactions to medications - damage to eyes, teeth, lips or other oral mucosa - nerve  damage due to positioning  - sore throat or hoarseness - Damage to heart, brain, nerves, lungs, other parts of body or loss of life  Patient voiced understanding.)       Anesthesia Quick Evaluation

## 2021-05-19 NOTE — ED Triage Notes (Signed)
Pt to ER via POV with complaints of a possible abscess present to buttocks. Reports sore area came up approx 2 weeks ago, no drainage but area is very painful and has grown in size to approx a quarter.

## 2021-05-19 NOTE — Transfer of Care (Signed)
Immediate Anesthesia Transfer of Care Note  Patient: Todd Oneill  Procedure(s) Performed: Francia Greaves UNDER ANESTHESIA (Rectum) INCISION AND DRAINAGE ABSCESS (Rectum)  Patient Location: PACU  Anesthesia Type:General  Level of Consciousness: drowsy  Airway & Oxygen Therapy: Patient Spontanous Breathing and Patient connected to face mask oxygen  Post-op Assessment: Report given to RN and Post -op Vital signs reviewed and stable  Post vital signs: Reviewed  Last Vitals:  Vitals Value Taken Time  BP    Temp    Pulse 95 05/19/21 2121  Resp 20 05/19/21 2121  SpO2 97 % 05/19/21 2121  Vitals shown include unvalidated device data.  Last Pain:  Vitals:   05/19/21 2010  TempSrc:   PainSc: 7          Complications: No notable events documented.

## 2021-05-19 NOTE — Anesthesia Procedure Notes (Signed)
Procedure Name: Intubation Date/Time: 05/19/2021 8:39 PM Performed by: Rolla Plate, CRNA Pre-anesthesia Checklist: Patient identified, Patient being monitored, Timeout performed, Emergency Drugs available and Suction available Patient Re-evaluated:Patient Re-evaluated prior to induction Oxygen Delivery Method: Circle system utilized Preoxygenation: Pre-oxygenation with 100% oxygen Induction Type: IV induction Ventilation: Mask ventilation without difficulty Laryngoscope Size: Mac and 3 Grade View: Grade I Tube type: Oral Tube size: 7.5 mm Number of attempts: 1 Airway Equipment and Method: Stylet and Video-laryngoscopy Placement Confirmation: ETT inserted through vocal cords under direct vision, positive ETCO2 and breath sounds checked- equal and bilateral Secured at: 21 cm Tube secured with: Tape Dental Injury: Teeth and Oropharynx as per pre-operative assessment

## 2021-05-20 ENCOUNTER — Encounter: Payer: Self-pay | Admitting: Surgery

## 2021-05-20 LAB — CBC
HCT: 40.7 % (ref 39.0–52.0)
Hemoglobin: 13.7 g/dL (ref 13.0–17.0)
MCH: 29.1 pg (ref 26.0–34.0)
MCHC: 33.7 g/dL (ref 30.0–36.0)
MCV: 86.4 fL (ref 80.0–100.0)
Platelets: 282 10*3/uL (ref 150–400)
RBC: 4.71 MIL/uL (ref 4.22–5.81)
RDW: 11.9 % (ref 11.5–15.5)
WBC: 11.4 10*3/uL — ABNORMAL HIGH (ref 4.0–10.5)
nRBC: 0 % (ref 0.0–0.2)

## 2021-05-20 MED ORDER — LIDOCAINE 5 % EX OINT: 1.0000 "application " | TOPICAL_OINTMENT | CUTANEOUS | 0 refills | Status: DC | PRN

## 2021-05-20 MED ORDER — ACETAMINOPHEN 325 MG PO TABS: 650.0000 mg | ORAL_TABLET | Freq: Three times a day (TID) | ORAL | 0 refills | Status: AC | PRN

## 2021-05-20 MED ORDER — HYDROCODONE-ACETAMINOPHEN 5-325 MG PO TABS: 1.0000 | ORAL_TABLET | Freq: Four times a day (QID) | ORAL | 0 refills | Status: DC | PRN

## 2021-05-20 MED ORDER — DSS 250 MG PO CAPS: 1.0000 | ORAL_CAPSULE | Freq: Every day | ORAL | 0 refills | Status: DC | PRN

## 2021-05-20 MED ORDER — IBUPROFEN 800 MG PO TABS: 800.0000 mg | ORAL_TABLET | Freq: Three times a day (TID) | ORAL | 0 refills | Status: DC | PRN

## 2021-05-20 NOTE — Progress Notes (Signed)
Patient discharged to home, accompanied by friend.  Patient discharged with all pertinent information, discharge instructions, prescriptions, and personal belongings.  Patient stated understanding and able to teach back.  IV site d/ced.  No acute distress noted.  Care relinquished.

## 2021-05-20 NOTE — Anesthesia Postprocedure Evaluation (Signed)
Anesthesia Post Note  Patient: Raylen Tangonan Red  Procedure(s) Performed: Francia Greaves UNDER ANESTHESIA (Rectum) INCISION AND DRAINAGE ABSCESS (Rectum)  Patient location during evaluation: PACU Anesthesia Type: General Level of consciousness: awake and alert Pain management: pain level controlled Vital Signs Assessment: post-procedure vital signs reviewed and stable Respiratory status: spontaneous breathing, nonlabored ventilation and respiratory function stable Cardiovascular status: blood pressure returned to baseline and stable Postop Assessment: no apparent nausea or vomiting Anesthetic complications: no   No notable events documented.   Last Vitals:  Vitals:   05/20/21 0010 05/20/21 0441  BP: (!) 129/92 126/78  Pulse: 84 (!) 57  Resp: 16 14  Temp: (!) 36.4 C 36.6 C  SpO2: 100% 100%    Last Pain:  Vitals:   05/20/21 0441  TempSrc: Oral  PainSc:                  Foye Deer

## 2021-05-20 NOTE — Discharge Instructions (Signed)
I&D, Care After This sheet gives you information about how to care for yourself after your procedure. Your health care provider may also give you more specific instructions. If you have problems or questions, contact your health care provider. What can I expect after the procedure? After the procedure, it is common to have: Soreness. Bruising. Itching.  Follow these instructions at home: site care Follow instructions from your health care provider about how to take care of your site. Make sure you: LEAVE packing in place until it falls out on its own.  No need to replace afterwards If the area bleeds or bruises, apply gentle pressure for 10 minutes. OK TO SHOWER IN 24HRS  General instructions Rest and then return to your normal activities as told by your health care provider. tylenol and advil as needed for discomfort.  Please alternate between the two every four hours as needed for pain.    Use narcotics, if prescribed, only when tylenol and motrin is not enough to control pain.  325-650mg  every 8hrs to max of 3000mg /24hrs (including the 325mg  in every norco dose) for the tylenol.    Advil up to 800mg  per dose every 8hrs as needed for pain.   Keep all follow-up visits as told by your health care provider. This is important. Contact a health care provider if you have excessive: redness, swelling, or pain around your site. blood coming from your site. pus or a bad smell coming from your site. You have a fever.  Get help right away if: You have bleeding that does not stop with pressure or a dressing. Summary After the procedure, it is common to have some soreness, bruising, and itching at the site. Follow instructions from your health care provider about how to take care of your site. Keep all follow-up visits as told by your health care provider. This is important. This information is not intended to replace advice given to you by your health care provider. Make sure you discuss any  questions you have with your health care provider. Document Released: 10/08/2015 Document Revised: 03/11/2018 Document Reviewed: 03/11/2018 Elsevier Interactive Patient Education  10/10/2015.

## 2021-05-20 NOTE — Op Note (Signed)
Preoperative diagnosis: perianal abscess Postoperative diagnosis: same  Procedure: Exam under anesthesia, incision and drainage of perianal abscess  Anesthesia: LMA  Surgeon: Tonna Boehringer  Wound Classification: Clean Contaminated  Specimen: None  Complications: None  EBL: 3 mL  Indications:  Patient is a 20 y.o. male with clinical exam concerning for an perianal abscess  Here today for formal exam under anesthesia.  See H&P for further details.  Description of procedure: The patient was taken to the operating room and placed in high lithotomy position.  LMA anesthesia was induced without any difficulty. A time-out was completed verifying correct patient, procedure, site, positioning, and implant(s) and/or special equipment prior to beginning this procedure.  Digital rectal exam noted obvious indurated tissue  at 9 and 3oclock position, 2cm from anal verge. Visualization within the lumen shortly afterwards confirmed the above findings.    Local infused to area. Two indurated areas mucosa incised and sphincter muscles preserved.  Purulent pocket noted at each site under the sphincter muscles after separating the muscled gently with hemostats.  Areas drained completely and irrigated.  After confirming hemostasis, exparel injected as perinal block, surgifoam placed and then cover with 4 x 4's, secured with mesh undergarments.  The patient tolerated the procedure well and  taken to the postoperative care unit in stable condition.  Sponge and instrument count was correct at the end of the procedure.

## 2021-05-23 NOTE — Discharge Summary (Signed)
Physician Discharge Summary  Patient ID: NEVAAN BUNTON MRN: [id number] DOB/AGE: 21-21-2002 20 y.o.  Admit date: 05/19/2021 Discharge date: 05/20/21  Admission Diagnoses: perianal abscess  Discharge Diagnoses:  Same as above  Discharged Condition: good  Hospital Course: admitted for above.  Underwent EUA, I&D.  See op note for details.  Observed overnight and at time of discharge, pain controlled, no evidence of further bleeding or persistent infection. Ready for d/c  Consults: None  Discharge Exam: Blood pressure 131/73, pulse (!) 59, temperature (!) 97.4 F (36.3 C), temperature source Oral, resp. rate 16, height 5\' 7"  (1.702 m), weight 59 kg, SpO2 100 %. General appearance: alert, cooperative, and no distress Skin: much improved TTP around perianal area, no active drainage  Disposition:  Discharge disposition: 01-Home or Self Care       Discharge Instructions     Discharge patient   Complete by: As directed    Discharge disposition: 01-Home or Self Care   Discharge patient date: 05/20/2021      Allergies as of 05/20/2021   No Known Allergies      Medication List     TAKE these medications    acetaminophen 325 MG tablet Commonly known as: Tylenol Take 2 tablets (650 mg total) by mouth every 8 (eight) hours as needed for mild pain.   DSS 250 MG Caps Take 1 capsule by mouth daily as needed (for constipation).   HYDROcodone-acetaminophen 5-325 MG tablet Commonly known as: Norco Take 1 tablet by mouth every 6 (six) hours as needed for up to 6 doses for moderate pain.   ibuprofen 800 MG tablet Commonly known as: ADVIL Take 1 tablet (800 mg total) by mouth every 8 (eight) hours as needed for mild pain or moderate pain.   lidocaine 5 % ointment Commonly known as: XYLOCAINE Apply 1 application topically as needed. Up to three times a day as needed for pain        Follow-up Information     Costilla, Berdell Hostetler, DO. Go on 05/25/2021.   Specialty: Surgery Why:  for wound check post op I&D 8:30am appointment Contact information: [address] [place] [phone]                  Total time spent arranging discharge was >81min. Signed: 31m 05/23/2021, 8:19 AM

## 2022-01-15 ENCOUNTER — Emergency Department: Payer: Medicaid Other

## 2022-01-15 ENCOUNTER — Emergency Department
Admission: EM | Admit: 2022-01-15 | Discharge: 2022-01-15 | Disposition: A | Discharge status: Home / Self Care | Payer: Medicaid Other | Source: Home / Self Care | Attending: Emergency Medicine | Admitting: Emergency Medicine

## 2022-01-15 ENCOUNTER — Other Ambulatory Visit: Payer: Self-pay

## 2022-01-15 ENCOUNTER — Emergency Department
Admission: EM | Admit: 2022-01-15 | Discharge: 2022-01-15 | Disposition: A | Discharge status: Home / Self Care | Payer: Medicaid Other | Attending: Emergency Medicine | Admitting: Emergency Medicine

## 2022-01-15 DIAGNOSIS — K6289 Other specified diseases of anus and rectum: Secondary | ICD-10-CM | POA: Diagnosis present

## 2022-01-15 DIAGNOSIS — R7401 Elevation of levels of liver transaminase levels: Secondary | ICD-10-CM | POA: Insufficient documentation

## 2022-01-15 DIAGNOSIS — E876 Hypokalemia: Secondary | ICD-10-CM | POA: Insufficient documentation

## 2022-01-15 DIAGNOSIS — D72829 Elevated white blood cell count, unspecified: Secondary | ICD-10-CM | POA: Diagnosis not present

## 2022-01-15 DIAGNOSIS — R1013 Epigastric pain: Secondary | ICD-10-CM

## 2022-01-15 DIAGNOSIS — R109 Unspecified abdominal pain: Secondary | ICD-10-CM | POA: Diagnosis not present

## 2022-01-15 DIAGNOSIS — K2100 Gastro-esophageal reflux disease with esophagitis, without bleeding: Secondary | ICD-10-CM | POA: Insufficient documentation

## 2022-01-15 LAB — COMPREHENSIVE METABOLIC PANEL
ALT: 17 U/L (ref 0–44)
ALT: 25 U/L (ref 0–44)
AST: 22 U/L (ref 15–41)
AST: 44 U/L — ABNORMAL HIGH (ref 15–41)
Albumin: 4.6 g/dL (ref 3.5–5.0)
Albumin: 4.3 g/dL (ref 3.5–5.0)
Alkaline Phosphatase: 45 U/L (ref 38–126)
Alkaline Phosphatase: 42 U/L (ref 38–126)
Anion gap: 10 (ref 5–15)
Anion gap: 7 (ref 5–15)
BUN: 13 mg/dL (ref 6–20)
BUN: 13 mg/dL (ref 6–20)
CO2: 27 mmol/L (ref 22–32)
CO2: 23 mmol/L (ref 22–32)
Calcium: 9.8 mg/dL (ref 8.9–10.3)
Calcium: 9.4 mg/dL (ref 8.9–10.3)
Chloride: 103 mmol/L (ref 98–111)
Chloride: 106 mmol/L (ref 98–111)
Creatinine, Ser: 0.96 mg/dL (ref 0.61–1.24)
Creatinine, Ser: 1.05 mg/dL (ref 0.61–1.24)
GFR, Estimated: >60 mL/min (ref >60)
GFR, Estimated: >60 mL/min (ref >60)
Glucose, Bld: 66 mg/dL — ABNORMAL LOW (ref 70–99)
Glucose, Bld: 119 mg/dL — ABNORMAL HIGH (ref 70–99)
Potassium: 3.4 mmol/L — ABNORMAL LOW (ref 3.5–5.1)
Potassium: 3.1 mmol/L — ABNORMAL LOW (ref 3.5–5.1)
Sodium: 140 mmol/L (ref 135–145)
Sodium: 136 mmol/L (ref 135–145)
Total Bilirubin: 0.7 mg/dL (ref 0.3–1.2)
Total Bilirubin: 0.9 mg/dL (ref 0.3–1.2)
Total Protein: 7.7 g/dL (ref 6.5–8.1)
Total Protein: 7.3 g/dL (ref 6.5–8.1)

## 2022-01-15 LAB — CHLAMYDIA/NGC RT PCR (ARMC ONLY)
Chlamydia Tr: NOT DETECTED
N gonorrhoeae: NOT DETECTED

## 2022-01-15 LAB — CBC
HCT: 40 % (ref 39.0–52.0)
Hemoglobin: 12.9 g/dL — ABNORMAL LOW (ref 13.0–17.0)
MCH: 28.5 pg (ref 26.0–34.0)
MCHC: 32.3 g/dL (ref 30.0–36.0)
MCV: 88.5 fL (ref 80.0–100.0)
Platelets: 245 10*3/uL (ref 150–400)
RBC: 4.52 MIL/uL (ref 4.22–5.81)
RDW: 11.9 % (ref 11.5–15.5)
WBC: 6.7 10*3/uL (ref 4.0–10.5)
nRBC: 0 % (ref 0.0–0.2)

## 2022-01-15 LAB — CBC WITH DIFFERENTIAL/PLATELET
Abs Immature Granulocytes: 0.01 10*3/uL (ref 0.00–0.07)
Basophils Absolute: 0 10*3/uL (ref 0.0–0.1)
Basophils Relative: 0 %
Eosinophils Absolute: 0.1 10*3/uL (ref 0.0–0.5)
Eosinophils Relative: 2 %
HCT: 41.2 % (ref 39.0–52.0)
Hemoglobin: 13.2 g/dL (ref 13.0–17.0)
Immature Granulocytes: 0 %
Lymphocytes Relative: 57 %
Lymphs Abs: 3.2 10*3/uL (ref 0.7–4.0)
MCH: 28.9 pg (ref 26.0–34.0)
MCHC: 32 g/dL (ref 30.0–36.0)
MCV: 90.2 fL (ref 80.0–100.0)
Monocytes Absolute: 0.4 10*3/uL (ref 0.1–1.0)
Monocytes Relative: 8 %
Neutro Abs: 1.9 10*3/uL (ref 1.7–7.7)
Neutrophils Relative %: 33 %
Platelets: 249 10*3/uL (ref 150–400)
RBC: 4.57 MIL/uL (ref 4.22–5.81)
RDW: 11.9 % (ref 11.5–15.5)
WBC: 5.7 10*3/uL (ref 4.0–10.5)
nRBC: 0 % (ref 0.0–0.2)

## 2022-01-15 LAB — URINALYSIS, ROUTINE W REFLEX MICROSCOPIC
Bilirubin Urine: NEGATIVE
Glucose, UA: NEGATIVE mg/dL
Hgb urine dipstick: NEGATIVE
Ketones, ur: NEGATIVE mg/dL
Nitrite: NEGATIVE
Protein, ur: NEGATIVE mg/dL
Specific Gravity, Urine: 1.026 (ref 1.005–1.030)
pH: 7 (ref 5.0–8.0)

## 2022-01-15 LAB — CBG MONITORING, ED: Glucose-Capillary: 124 mg/dL — ABNORMAL HIGH (ref 70–99)

## 2022-01-15 LAB — LIPASE, BLOOD: Lipase: 50 U/L (ref 11–51)

## 2022-01-15 MED ORDER — PANTOPRAZOLE SODIUM 40 MG PO TBEC: 40.0000 mg | DELAYED_RELEASE_TABLET | Freq: Every day | ORAL | 0 refills | Status: DC

## 2022-01-15 MED ORDER — DICYCLOMINE HCL 10 MG/ML IM SOLN
20.0000 mg | Freq: Once | INTRAMUSCULAR | Status: AC
Start: 2022-01-15 — End: 2022-01-15
  Administered 2022-01-15: 20 mg via INTRAMUSCULAR
  Filled 2022-01-15: qty 2

## 2022-01-15 MED ORDER — MORPHINE SULFATE (PF) 4 MG/ML IV SOLN
4.0000 mg | Freq: Once | INTRAVENOUS | Status: DC
Start: 2022-01-15 — End: 2022-01-15

## 2022-01-15 MED ORDER — SUCRALFATE 1 G PO TABS: 1.0000 g | ORAL_TABLET | Freq: Four times a day (QID) | ORAL | 0 refills | Status: DC

## 2022-01-15 MED ORDER — MORPHINE SULFATE (PF) 4 MG/ML IV SOLN
4.0000 mg | Freq: Once | INTRAVENOUS | Status: AC
  Administered 2022-01-15: 4 mg via INTRAVENOUS
  Filled 2022-01-15: qty 1

## 2022-01-15 MED ORDER — IOHEXOL 300 MG/ML  SOLN
100.0000 mL | Freq: Once | INTRAMUSCULAR | Status: AC | PRN
  Administered 2022-01-15: 100 mL via INTRAVENOUS

## 2022-01-15 MED ORDER — ALUM & MAG HYDROXIDE-SIMETH 200-200-20 MG/5ML PO SUSP
30.0000 mL | Freq: Once | ORAL | Status: AC
  Administered 2022-01-15: 30 mL via ORAL
  Filled 2022-01-15: qty 30

## 2022-01-15 MED ORDER — ONDANSETRON HCL 4 MG/2ML IJ SOLN: 4.0000 mg | Freq: Once | INTRAMUSCULAR | Status: DC

## 2022-01-15 MED ORDER — FAMOTIDINE 20 MG PO TABS
20.0000 mg | ORAL_TABLET | Freq: Once | ORAL | Status: AC
  Administered 2022-01-15: 20 mg via ORAL
  Filled 2022-01-15: qty 1

## 2022-01-15 MED ORDER — DOXYCYCLINE HYCLATE 100 MG PO TABS
100.0000 mg | ORAL_TABLET | Freq: Once | ORAL | Status: AC
  Administered 2022-01-15: 100 mg via ORAL
  Filled 2022-01-15: qty 1

## 2022-01-15 MED ORDER — ONDANSETRON HCL 4 MG/2ML IJ SOLN
4.0000 mg | Freq: Once | INTRAMUSCULAR | Status: AC
Start: 2022-01-15 — End: 2022-01-15
  Administered 2022-01-15: 4 mg via INTRAVENOUS
  Filled 2022-01-15: qty 2

## 2022-01-15 MED ORDER — KETOROLAC TROMETHAMINE 30 MG/ML IJ SOLN
30.0000 mg | Freq: Once | INTRAMUSCULAR | Status: AC
Start: 2022-01-15 — End: 2022-01-15
  Administered 2022-01-15: 30 mg via INTRAVENOUS
  Filled 2022-01-15: qty 1

## 2022-01-15 MED ORDER — DICYCLOMINE HCL 20 MG PO TABS: 20.0000 mg | ORAL_TABLET | Freq: Three times a day (TID) | ORAL | 0 refills | Status: DC | PRN

## 2022-01-15 MED ORDER — SODIUM CHLORIDE 0.9 % IV SOLN
1.0000 g | Freq: Once | INTRAVENOUS | Status: AC
  Administered 2022-01-15: 1 g via INTRAVENOUS
  Filled 2022-01-15: qty 10

## 2022-01-15 MED ORDER — DOXYCYCLINE HYCLATE 100 MG PO TABS
100.0000 mg | ORAL_TABLET | Freq: Two times a day (BID) | ORAL | 0 refills | Status: DC
Start: 2022-01-15 — End: 2022-05-11

## 2022-01-15 MED ORDER — DOCUSATE SODIUM 100 MG PO CAPS: 100.0000 mg | ORAL_CAPSULE | Freq: Two times a day (BID) | ORAL | 0 refills | Status: DC

## 2022-01-15 MED ORDER — HYDROCODONE-ACETAMINOPHEN 5-325 MG PO TABS: 1.0000 | ORAL_TABLET | ORAL | 0 refills | Status: DC | PRN

## 2022-01-15 MED ORDER — ONDANSETRON 4 MG PO TBDP: 4.0000 mg | ORAL_TABLET | Freq: Four times a day (QID) | ORAL | 0 refills | Status: DC | PRN

## 2022-01-15 MED ORDER — LIDOCAINE VISCOUS HCL 2 % MT SOLN
15.0000 mL | Freq: Once | OROMUCOSAL | Status: AC
Start: 2022-01-15 — End: 2022-01-15
  Administered 2022-01-15: 15 mL via ORAL
  Filled 2022-01-15: qty 15

## 2022-01-15 NOTE — ED Provider Notes (Signed)
? ?[facility] ?Provider Note ? ? ? Event Date/Time  ? First MD Initiated Contact with Patient 01/15/22 [phone]   ?  (approximate) ? ? ?History  ? ?Rectal Pain ? ? ?HPI ? ?Todd Oneill is a 21 y.o. male with history of perianal abscess who presents to the emergency department with complaints of diffuse abdominal pain and rectal pain over the past day.  No fevers, chills, nausea, vomiting, diarrhea, dysuria, hematuria, testicular pain or swelling, penile discharge. ? ? ?History provided by patient and family. ? ? ? ?History reviewed. No pertinent past medical history. ? ?Past Surgical History:  ?Procedure Laterality Date  ? INCISION AND DRAINAGE ABSCESS N/A 05/19/2021  ? Procedure: INCISION AND DRAINAGE ABSCESS;  Surgeon: Sung Amabile, DO;  Location: [facility] ORS;  Service: General;  Laterality: N/A;  ? ? ?MEDICATIONS:  ?Prior to Admission medications   ?Medication Sig Start Date End Date Taking? Authorizing Provider  ?Docusate Sodium (DSS) 250 MG CAPS Take 1 capsule by mouth daily as needed (for constipation). 05/20/21   Sung Amabile, DO  ?HYDROcodone-acetaminophen (NORCO) 5-325 MG tablet Take 1 tablet by mouth every 6 (six) hours as needed for up to 6 doses for moderate pain. 05/20/21   Sung Amabile, DO  ?ibuprofen (ADVIL) 800 MG tablet Take 1 tablet (800 mg total) by mouth every 8 (eight) hours as needed for mild pain or moderate pain. 05/20/21   Sung Amabile, DO  ?lidocaine (XYLOCAINE) 5 % ointment Apply 1 application topically as needed. Up to three times a day as needed for pain 05/20/21   Sung Amabile, DO  ? ? ?Physical Exam  ? ?Triage Vital Signs: ?ED Triage Vitals [01/15/22 0133]  ?Enc Vitals Group  ?   BP (!) 142/91  ?   Pulse Rate (!) 106  ?   Resp 20  ?   Temp 98 ?F (36.7 ?C)  ?   Temp Source Oral  ?   SpO2 96 %  ?   Weight 130 lb (59 kg)  ?   Height 5\' 7"  (1.702 m)  ?   Head Circumference   ?   Peak Flow   ?   Pain Score 10  ?   Pain Loc   ?   Pain Edu?   ?   Excl. in GC?   ? ? ?Most recent  vital signs: ?Vitals:  ? 01/15/22 0133 01/15/22 0345  ?BP: (!) 142/91 130/65  ?Pulse: (!) 106 64  ?Resp: 20 16  ?Temp: 98 ?F (36.7 ?C)   ?SpO2: 96% 100%  ? ? ?CONSTITUTIONAL: Alert and oriented and responds appropriately to questions. Well-appearing; well-nourished, afebrile and nontoxic ?HEAD: Normocephalic, atraumatic ?EYES: Conjunctivae clear, pupils appear equal, sclera nonicteric ?ENT: normal nose; moist mucous membranes ?NECK: Supple, normal ROM ?CARD: RRR; S1 and S2 appreciated; no murmurs, no clicks, no rubs, no gallops ?RESP: Normal chest excursion without splinting or tachypnea; breath sounds clear and equal bilaterally; no wheezes, no rhonchi, no rales, no hypoxia or respiratory distress, speaking full sentences ?ABD/GI: Normal bowel sounds; non-distended; soft, diffusely tender to palpation without guarding or rebound ?RECTAL:  Normal rectal tone, no gross blood or melena,, no hemorrhoids appreciated, he is tender to palpation on rectal exam and tender over the prostate but I do not appreciate any significant prostate swelling, no fecal impaction. Chaperone present. ?BACK: The back appears normal ?EXT: Normal ROM in all joints; no deformity noted, no edema; no cyanosis ?SKIN: Normal color for age and race; warm; no  rash on exposed skin ?NEURO: Moves all extremities equally, normal speech ?PSYCH: The patient's mood and manner are appropriate. ? ? ?ED Results / Procedures / Treatments  ? ?LABS: ?(all labs ordered are listed, but only abnormal results are displayed) ?Labs Reviewed  ?COMPREHENSIVE METABOLIC PANEL - Abnormal; Notable for the following components:  ?    Result Value  ? Potassium 3.4 (*)   ? Glucose, Bld 66 (*)   ? All other components within normal limits  ?URINALYSIS, ROUTINE W REFLEX MICROSCOPIC - Abnormal; Notable for the following components:  ? Color, Urine YELLOW (*)   ? APPearance HAZY (*)   ? Leukocytes,Ua TRACE (*)   ? Bacteria, UA RARE (*)   ? All other components within normal  limits  ?CBG MONITORING, ED - Abnormal; Notable for the following components:  ? Glucose-Capillary 124 (*)   ? All other components within normal limits  ?URINE CULTURE  ?CHLAMYDIA/NGC RT PCR ([facility] ONLY)            ?CBC WITH DIFFERENTIAL/PLATELET  ? ? ? ?EKG: ? ? ?RADIOLOGY: ?My personal review and interpretation of imaging: CT scan shows no acute abnormality. ? ?I have personally reviewed all radiology reports.   ?CT ABDOMEN PELVIS W CONTRAST ? ?Result Date: 01/15/2022 ?CLINICAL DATA:  Rectal/groin pain, history of perianal abscess status post surgery EXAM: CT ABDOMEN AND PELVIS WITH CONTRAST TECHNIQUE: Multidetector CT imaging of the abdomen and pelvis was performed using the standard protocol following bolus administration of intravenous contrast. RADIATION DOSE REDUCTION: This exam was performed according to the departmental dose-optimization program which includes automated exposure control, adjustment of the mA and/or kV according to patient size and/or use of iterative reconstruction technique. CONTRAST:  100mL OMNIPAQUE IOHEXOL 300 MG/ML  SOLN COMPARISON:  CT pelvis dated 05/19/2021 FINDINGS: Lower chest: Lung bases are clear. Hepatobiliary: Liver is within normal limits. Gallbladder is decompressed. No intrahepatic or extrahepatic ductal dilatation. Pancreas: Within normal limits. Spleen: Within normal limits. Adrenals/Urinary Tract: Adrenal glands are within normal limits. Kidneys are within normal limits.  No hydronephrosis. Bladder is within normal limits. Stomach/Bowel: Stomach is within normal limits. No evidence of bowel obstruction. Normal appendix (series 2/image 86). No colonic wall thickening or inflammatory changes. No perirectal fluid collection/abscess on CT. Vascular/Lymphatic: No evidence of abdominal aortic aneurysm. No suspicious abdominopelvic lymphadenopathy. Reproductive: Prostate is unremarkable. Other: No abdominopelvic ascites. Musculoskeletal: Visualized osseous structures are within  normal limits. IMPRESSION: No perirectal fluid collection/abscess on CT. No colonic wall thickening or inflammatory changes. Electronically Signed   By: Charline BillsSriyesh  Krishnan M.D.   On: 01/15/2022 03:33   ? ? ?PROCEDURES: ? ?Critical Care performed: No ? ? ? ?Procedures ? ? ? ?IMPRESSION / MDM / ASSESSMENT AND PLAN / ED COURSE  ?I reviewed the triage vital signs and the nursing notes. ? ? ? ?Patient here with complaints of abdominal pain, rectal pain.  Has history of perianal abscess that was drained in the operating room with Dr. Tonna BoehringerSakai. ? ? ? ?DIFFERENTIAL DIAGNOSIS (includes but not limited to):   Perianal abscess, perirectal abscess, anal fissure, prostatitis, UTI, appendicitis, colitis, diverticulitis, bowel obstruction ? ? ?PLAN: We will obtain CT of the abdomen pelvis, CBC, CMP, lipase, urinalysis, urine gonorrhea and chlamydia.  Will give IV fluids, pain and nausea medicine. ? ? ?MEDICATIONS GIVEN IN ED: ?Medications  ?doxycycline (VIBRA-TABS) tablet 100 mg (has no administration in time range)  ?iohexol (OMNIPAQUE) 300 MG/ML solution 100 mL (100 mLs Intravenous Contrast Given 01/15/22 0320)  ?ketorolac (TORADOL) 30  MG/ML injection 30 mg (30 mg Intravenous Given 01/15/22 0417)  ?ondansetron [facility]) injection 4 mg (4 mg Intravenous Given 01/15/22 0414)  ?morphine (PF) 4 MG/ML injection 4 mg (4 mg Intravenous Given 01/15/22 0419)  ?cefTRIAXone (ROCEPHIN) 1 g in sodium chloride 0.9 % 100 mL IVPB (1 g Intravenous New Bag/Given 01/15/22 0425)  ? ? ? ?ED COURSE: Patient's labs show no leukocytosis.  Normal hemoglobin.  Normal electrolytes.  Glucose is slightly low.  Will allow him to eat and drink and repeat his CBG.  Urine does show trace leukocyte esterase and 21-50 white blood cells and rare bacteria.  We will send a urine culture.  CT of the abdomen pelvis reviewed by myself and radiologist and shows no acute abnormality.  Normal-appearing appendix, bladder and kidneys, prostate.  There is no perirectal or perianal  abscess on imaging.  No inflammatory changes around this area.  Discussed with patient his urine findings and the fact that he is tender on prostate exam and does have anal intercourse that this could be earl

## 2022-01-15 NOTE — ED Triage Notes (Signed)
Pt come with c/o abdominal pain. Pt was seen here earlier today for groin pain and now it is belly pain. Pt states 10/10.  ? ?BP-150/88 ?HR-109 ?RR-28 ? ?Pt was hyperventilating with ems. Pt states diarrhea 3x today. Pt states dull pain. ?

## 2022-01-15 NOTE — Discharge Instructions (Addendum)
You are being provided a prescription for opiates (also known as narcotics) for pain control.  Opiates can be addictive and should only be used when absolutely necessary for pain control when other alternatives do not work.  We recommend you only use them for the recommended amount of time and only as prescribed.  Please do not take with other sedative medications or alcohol.  Please do not drive, operate machinery, make important decisions while taking opiates.  Please note that these medications can be addictive and have high abuse potential.  Patients can become addicted to narcotics after only taking them for a few days.  Please keep these medications locked away from children, teenagers or any family members with history of substance abuse.  Narcotic pain medicine may also make you constipated.  You may use over-the-counter medications such as MiraLAX, Colace to prevent constipation.  If you become constipated, you may use over-the-counter enemas as needed.  Itching and nausea are also common side effects of narcotic pain medication.  If you develop uncontrolled vomiting or a rash, please stop these medications and seek medical care.   Steps to find a Primary Care Provider (PCP):  Call [phone] or [phone] to access "[facility] Find a Doctor Service."  2.  You may also go on the [facility] website at [email or web address]   

## 2022-01-15 NOTE — ED Provider Notes (Signed)
? ?[facility] ?Provider Note ? ? ? Event Date/Time  ? First MD Initiated Contact with Patient 01/15/22 1455   ?  (approximate) ? ? ?History  ? ?Abdominal Pain ? ? ?HPI ? ?Todd Oneill is a 21 y.o. male here with abdominal pain.  The patient was just seen in the ED for her rectal pain.  It was thought secondary to prostatitis for which she was given antibiotics.  He states that his pain in his lower abdomen has actually improved, but he now has aching, throbbing, epigastric abdominal discomfort.  He states it is a cramp-like sensation.  It is slightly worse with eating.  Denies any vomiting.  He has had some mild loose stool as well, that this has been ongoing.  Regarding his lower abdominal pain, he states this is actually improved significantly since yesterday.  He does admit he has a history of acid reflux, and this feels somewhat similar.  Denies any blood in the stools.  Other complaints.  No fevers or chills. ?  ? ? ?Physical Exam  ? ?Triage Vital Signs: ?ED Triage Vitals  ?Enc Vitals Group  ?   BP 01/15/22 1321 (!) 145/85  ?   Pulse Rate 01/15/22 1321 66  ?   Resp 01/15/22 1321 18  ?   Temp 01/15/22 1321 97.9 ?F (36.6 ?C)  ?   Temp Source 01/15/22 1321 Oral  ?   SpO2 01/15/22 1321 98 %  ?   Weight --   ?   Height --   ?   Head Circumference --   ?   Peak Flow --   ?   Pain Score 01/15/22 1319 10  ?   Pain Loc --   ?   Pain Edu? --   ?   Excl. in GC? --   ? ? ?Most recent vital signs: ?Vitals:  ? 01/15/22 1321  ?BP: (!) 145/85  ?Pulse: 66  ?Resp: 18  ?Temp: 97.9 ?F (36.6 ?C)  ?SpO2: 98%  ? ? ? ?General: Awake, no distress.  ?CV:  Good peripheral perfusion.  ?Resp:  Normal effort.  ?Abd:  No distention.  Minimal epigastric abdominal discomfort, no rebound or guarding.  Negative Murphy's.  No right lower quadrant tenderness. ?Other:  Well-hydrated, in no distress. ? ? ?ED Results / Procedures / Treatments  ? ?Labs ?(all labs ordered are listed, but only abnormal results are  displayed) ?Labs Reviewed  ?COMPREHENSIVE METABOLIC PANEL - Abnormal; Notable for the following components:  ?    Result Value  ? Potassium 3.1 (*)   ? Glucose, Bld 119 (*)   ? AST 44 (*)   ? All other components within normal limits  ?CBC - Abnormal; Notable for the following components:  ? Hemoglobin 12.9 (*)   ? All other components within normal limits  ?LIPASE, BLOOD  ?URINALYSIS, ROUTINE W REFLEX MICROSCOPIC  ? ? ? ?EKG ? ? ? ?RADIOLOGY ?Reviewed CT scan from visit within the last 24 hours, which shows no acute abnormality on abdomen and pelvis.  Specifically, appendix appears normal. ? ? ?I also independently reviewed and agree wit radiologist interpretations. ? ? ?PROCEDURES: ? ?Critical Care performed: No ? ? ? ? ?MEDICATIONS ORDERED IN ED: ?Medications  ?alum & mag hydroxide-simeth (MAALOX/MYLANTA) 200-200-20 MG/5ML suspension 30 mL (30 mLs Oral Given 01/15/22 1600)  ?  And  ?lidocaine (XYLOCAINE) 2 % viscous mouth solution 15 mL (15 mLs Oral Given 01/15/22 1601)  ?dicyclomine (BENTYL) injection 20 mg (  20 mg Intramuscular Given 01/15/22 1604)  ?famotidine (PEPCID) tablet 20 mg (20 mg Oral Given 01/15/22 1559)  ? ? ? ?IMPRESSION / MDM / ASSESSMENT AND PLAN / ED COURSE  ?I reviewed the triage vital signs and the nursing notes. ?             ?               ? ?Ddx: Gastritis/GERD, esophagitis, dyspepsia, viral GI illness, foodborne illness ? ? ? ?MDM: 21 year old male here with epigastric abdominal discomfort.  Patient just had a fairly extensive ED evaluation including CT abdomen and pelvis earlier this morning, which I reviewed independently and it shows no evidence of acute normality.  Specifically, no evidence of gallstones or appendicitis.  Clinically, my suspicion is patient may have a mild esophagitis/GERD related to starting doxycycline.  He is taking this for suspected prostatitis.  He was also without food and had an irregular diet for the last 24 hours due to being in the ED.  Clinically here, his  abdomen is soft and nontender.  It feels markedly improved with GI cocktail.  Lab work was repeated, and remains very reassuring. ? ?CBC shows no leukocytosis.  CMP unremarkable.  Very slight AST elevation is largely unremarkable.  Mild hypokalemia persists, encourage patient to eat bananas/food at home.  Suspect replacing this with tablets would cause likely worsening of his gastritis/abdominal pain.  Lipase is normal. ? ?Given reassuring vitals, exam, recent CT scan, do not feel repeat imaging is indicated.  I suspect patient likely has GERD/gastritis or esophagitis.  We will start him on a PPI and Carafate.  Will trial Bentyl as well as needed for abdominal cramping.  No other apparent emergent pathology.  Return precautions given. ? ?MEDICATIONS GIVEN IN ED: ?Medications  ?alum & mag hydroxide-simeth (MAALOX/MYLANTA) 200-200-20 MG/5ML suspension 30 mL (30 mLs Oral Given 01/15/22 1600)  ?  And  ?lidocaine (XYLOCAINE) 2 % viscous mouth solution 15 mL (15 mLs Oral Given 01/15/22 1601)  ?dicyclomine (BENTYL) injection 20 mg (20 mg Intramuscular Given 01/15/22 1604)  ?famotidine (PEPCID) tablet 20 mg (20 mg Oral Given 01/15/22 1559)  ? ? ? ?Consults:  ? ? ? ?EMR reviewed  ?Reviewed ED visit from earlier this morning extensively, including labs and CT imaging. ?Reviewed prior surgery notes noting perianal abscess, including visit with Dr. Tonna Boehringer in 2022 ? ? ? ? ?FINAL CLINICAL IMPRESSION(S) / ED DIAGNOSES  ? ?Final diagnoses:  ?Epigastric pain  ?Gastroesophageal reflux disease with esophagitis without hemorrhage  ? ? ? ?Rx / DC Orders  ? ?ED Discharge Orders   ? ?      Ordered  ?  sucralfate (CARAFATE) 1 g tablet  4 times daily       ? 01/15/22 1700  ?  pantoprazole (PROTONIX) 40 MG tablet  Daily       ? 01/15/22 1700  ?  dicyclomine (BENTYL) 20 MG tablet  3 times daily PRN       ? 01/15/22 1700  ? ?  ?  ? ?  ? ? ? ?Note:  This document was prepared using Dragon voice recognition software and may include unintentional  dictation errors. ?  ?Shaune Pollack, MD ?01/15/22 1705 ? ?

## 2022-01-15 NOTE — ED Triage Notes (Signed)
Reports rectal pain and groin pain for last couple days. Denies bleeding. Had surgery for perianal abscess last year and says pain is similar.  ?

## 2022-01-16 LAB — URINE CULTURE: Culture: <10000 — AB

## 2022-01-17 LAB — HIV ANTIBODY (ROUTINE TESTING W REFLEX): HIV Screen 4th Generation wRfx: NONREACTIVE

## 2022-03-16 ENCOUNTER — Ambulatory Visit: Payer: Medicaid Other | Admitting: Nurse Practitioner

## 2022-03-18 ENCOUNTER — Other Ambulatory Visit: Payer: Self-pay

## 2022-03-18 ENCOUNTER — Emergency Department
Admission: EM | Admit: 2022-03-18 | Discharge: 2022-03-18 | Disposition: A | Discharge status: Home / Self Care | Payer: Medicaid Other | Attending: Emergency Medicine | Admitting: Emergency Medicine

## 2022-03-18 ENCOUNTER — Encounter: Payer: Self-pay | Admitting: Emergency Medicine

## 2022-03-18 DIAGNOSIS — K0889 Other specified disorders of teeth and supporting structures: Secondary | ICD-10-CM | POA: Diagnosis present

## 2022-03-18 DIAGNOSIS — M273 Alveolitis of jaws: Secondary | ICD-10-CM | POA: Diagnosis not present

## 2022-03-18 NOTE — ED Provider Notes (Signed)
[facility] Provider Note    Event Date/Time   First MD Initiated Contact with Patient 03/18/22 1054     (approximate)   History   No chief complaint on file.   HPI  Todd Oneill is a 21 y.o. male presents to the ED with complaint of dental pain and bleeding from having his wisdom teeth removed 2-1/2 days ago.  Patient complains of bleeding from the area along with worsening pain and swelling.      Physical Exam   Triage Vital Signs: ED Triage Vitals  Enc Vitals Group     BP 03/18/22 1050 (!) 151/107     Pulse Rate 03/18/22 1050 95     Resp 03/18/22 1050 18     Temp 03/18/22 1050 98.4 F (36.9 C)     Temp src --      SpO2 03/18/22 1050 98 %     Weight 03/18/22 1042 130 lb 1.1 oz (59 kg)     Height 03/18/22 1042 5\' 7"  (1.702 m)     Head Circumference --      Peak Flow --      Pain Score 03/18/22 1042 8     Pain Loc --      Pain Edu? --      Excl. in GC? --     Most recent vital signs: Vitals:   03/18/22 1050  BP: (!) 151/107  Pulse: 95  Resp: 18  Temp: 98.4 F (36.9 C)  SpO2: 98%     General: Awake, no distress.  CV:  Good peripheral perfusion.  Resp:  Normal effort.  Abd:  No distention.  Other:  Prior to exam patient spat out a clot of blood.  Most likely this came from the area that had been bleeding.  There was no active bleeding on exam.  No purulent drainage noted.  Patient is afebrile.   ED Results / Procedures / Treatments   Labs (all labs ordered are listed, but only abnormal results are displayed) Labs Reviewed - No data to display     PROCEDURES:  Critical Care performed:   Procedures   MEDICATIONS ORDERED IN ED: Medications - No data to display   IMPRESSION / MDM / ASSESSMENT AND PLAN / ED COURSE  I reviewed the triage vital signs and the nursing notes.   Differential diagnosis includes, but is not limited to, pain from dental extraction, dry socket, dental infection.  21 year old male  presents to the ED due to bleeding from his gums after he had 4 wisdom teeth removed on Thursday.  There is one placed on the right lower area that has continued to bleed and has more pain and tenderness in that area.  Patient did spit out a clot of blood and most likely this is a dry socket that is giving him increased pain.  Teabags were found and I explained that we were going to put this in some cold water and have him bite down on the teabags.  Mother became extremely upset stating that she called to the emergency department where she was told that we specifically treat dry sockets.  I explained to her that there is not a dentist on call for the emergency department and that teabags would be a good start in trying to reduce his pain.  Patient mother became extremely upset stating that she would go to the emergency department in [facility] and left prior to her son's discharge papers could be given to  her.      Patient's presentation is most consistent with acute, uncomplicated illness.  FINAL CLINICAL IMPRESSION(S) / ED DIAGNOSES   Final diagnoses:  Dry tooth socket     Rx / DC Orders   ED Discharge Orders     None        Note:  This document was prepared using Dragon voice recognition software and may include unintentional dictation errors.   Tommi Rumps, PA-C 03/18/22 1515    Georga Hacking, MD 03/18/22 1538

## 2022-03-31 ENCOUNTER — Ambulatory Visit: Payer: Medicaid Other | Admitting: Family Medicine

## 2022-04-05 ENCOUNTER — Ambulatory Visit: Payer: Medicaid Other | Admitting: Family Medicine

## 2022-04-25 ENCOUNTER — Ambulatory Visit: Payer: Medicaid Other | Admitting: Internal Medicine

## 2022-04-27 ENCOUNTER — Ambulatory Visit: Payer: Medicaid Other | Admitting: Family Medicine

## 2022-05-02 ENCOUNTER — Ambulatory Visit: Payer: Medicaid Other | Admitting: Internal Medicine

## 2022-05-07 ENCOUNTER — Encounter: Payer: Self-pay | Admitting: Nurse Practitioner

## 2022-05-11 ENCOUNTER — Ambulatory Visit (INDEPENDENT_AMBULATORY_CARE_PROVIDER_SITE_OTHER): Payer: Medicaid Other | Admitting: Nurse Practitioner

## 2022-05-11 ENCOUNTER — Encounter: Payer: Self-pay | Admitting: Nurse Practitioner

## 2022-05-11 VITALS — BP 124/81 | HR 77 | Ht 67.0 in | Wt 126.0 lb

## 2022-05-11 DIAGNOSIS — L0232 Furuncle of buttock: Secondary | ICD-10-CM | POA: Insufficient documentation

## 2022-05-11 DIAGNOSIS — Z0000 Encounter for general adult medical examination without abnormal findings: Secondary | ICD-10-CM | POA: Insufficient documentation

## 2022-05-11 NOTE — Assessment & Plan Note (Signed)
Encouraged patient to consume a balanced diet and regular exercise regimen. Advised to see an eye doctor and dentist annually.  Labs ordered. 

## 2022-05-11 NOTE — Assessment & Plan Note (Addendum)
Patient have history of anal boils. Would prefer to GI if have  consistent anal boils.

## 2022-05-11 NOTE — Progress Notes (Signed)
New Patient Office Visit  Subjective    Patient ID: Todd Oneill, male    DOB: 2001-04-11  Age: 21 y.o. MRN: [id number]  CC:  Chief Complaint  Patient presents with   New Patient (Initial Visit)    Patient here today to establish care and has complaints of recurring boils in anal area.     HPI PERRY BRUCATO presents to establish care and annual physical. He works at [employer]. He complaints of recurring boils in the anal area. He does not have any boils at present.     Outpatient Encounter Medications as of 05/11/2022  Medication Sig   pantoprazole (PROTONIX) 40 MG tablet Take 1 tablet (40 mg total) by mouth daily for 14 days.   [DISCONTINUED] dicyclomine (BENTYL) 20 MG tablet Take 1 tablet (20 mg total) by mouth 3 (three) times daily as needed for up to 5 days for spasms (abdominal pain/cramps).   [DISCONTINUED] docusate sodium (COLACE) 100 MG capsule Take 1 capsule (100 mg total) by mouth 2 (two) times daily.   [DISCONTINUED] doxycycline (VIBRA-TABS) 100 MG tablet Take 1 tablet (100 mg total) by mouth 2 (two) times daily.   [DISCONTINUED] HYDROcodone-acetaminophen (NORCO/VICODIN) 5-325 MG tablet Take 1 tablet by mouth every 4 (four) hours as needed.   [DISCONTINUED] ibuprofen (ADVIL) 800 MG tablet Take 1 tablet (800 mg total) by mouth every 8 (eight) hours as needed for mild pain or moderate pain.   [DISCONTINUED] lidocaine (XYLOCAINE) 5 % ointment Apply 1 application topically as needed. Up to three times a day as needed for pain   [DISCONTINUED] ondansetron (ZOFRAN-ODT) 4 MG disintegrating tablet Take 1 tablet (4 mg total) by mouth every 6 (six) hours as needed for nausea or vomiting.   [DISCONTINUED] sucralfate (CARAFATE) 1 g tablet Take 1 tablet (1 g total) by mouth 4 (four) times daily for 7 days.   No facility-administered encounter medications on file as of 05/11/2022.    Past Medical History:  Diagnosis Date   Heartburn     Past Surgical History:  Procedure  Laterality Date   INCISION AND DRAINAGE ABSCESS N/A 05/19/2021   Procedure: INCISION AND DRAINAGE ABSCESS;  Surgeon: Sung Amabile, DO;  Location: [facility] ORS;  Service: General;  Laterality: N/A;    Family History  Problem Relation Age of Onset   Diabetes Paternal Grandmother     Social History   Socioeconomic History   Marital status: Single    Spouse name: Not on file   Number of children: Not on file   Years of education: Not on file   Highest education level: Not on file  Occupational History   Not on file  Tobacco Use   Smoking status: Never   Smokeless tobacco: Former  Building services engineer Use: Never used  Substance and Sexual Activity   Alcohol use: Not Currently   Drug use: Yes    Types: Marijuana   Sexual activity: Not Currently  Other Topics Concern   Not on file  Social History Narrative   Not on file   Social Determinants of Health   Financial Resource Strain: Not on file  Food Insecurity: Not on file  Transportation Needs: Not on file  Physical Activity: Not on file  Stress: Not on file  Social Connections: Not on file  Intimate Partner Violence: Not on file    Review of Systems  Constitutional: Negative.   HENT: Negative.    Eyes: Negative.   Respiratory:  Negative for cough and  shortness of breath.   Cardiovascular:  Negative for chest pain and leg swelling.  Gastrointestinal: Negative.   Genitourinary: Negative.   Musculoskeletal: Negative.   Skin: Negative.   Neurological:  Negative for dizziness, tingling and headaches.  Psychiatric/Behavioral:  Negative for depression, substance abuse and suicidal ideas.         Objective    Ht 5\' 7"  (1.702 m)   Wt 126 lb (57.2 kg)   BMI 19.73 kg/m   Physical Exam Constitutional:      Appearance: Normal appearance. He is normal weight.  HENT:     Head: Normocephalic and atraumatic.     Right Ear: Tympanic membrane normal.     Left Ear: Tympanic membrane normal.     Nose: Nose normal.      Mouth/Throat:     Mouth: Mucous membranes are moist.     Pharynx: Oropharynx is clear.  Eyes:     Conjunctiva/sclera: Conjunctivae normal.     Pupils: Pupils are equal, round, and reactive to light.  Cardiovascular:     Rate and Rhythm: Normal rate and regular rhythm.     Pulses: Normal pulses.     Heart sounds: Normal heart sounds. No murmur heard. Pulmonary:     Effort: Pulmonary effort is normal. No respiratory distress.     Breath sounds: Normal breath sounds. No stridor.  Abdominal:     General: Abdomen is flat. Bowel sounds are normal.     Palpations: Abdomen is soft.     Tenderness: There is no abdominal tenderness.     Hernia: No hernia is present.  Musculoskeletal:        General: Normal range of motion.     Cervical back: Normal range of motion.  Skin:    General: Skin is warm.     Capillary Refill: Capillary refill takes less than 2 seconds.  Neurological:     General: No focal deficit present.     Mental Status: He is alert and oriented to person, place, and time. Mental status is at baseline.  Psychiatric:        Mood and Affect: Mood normal.        Behavior: Behavior normal.        Thought Content: Thought content normal.        Judgment: Judgment normal.         Assessment & Plan:   Problem List Items Addressed This Visit       Digestive   Recurrent boils of anus    Patient have history of anal boils. Would prefer to GI if have  consistent anal boils.        Other   Annual physical exam - Primary    Encouraged patient to consume a balanced diet and regular exercise regimen. Advised to see an eye doctor and dentist annually.  Labs ordered.      Relevant Orders   CBC with Differential/Platelet (Completed)   COMPLETE METABOLIC PANEL WITH GFR (Completed)   Lipid panel (Completed)   TSH (Completed)    No follow-ups on file.   , NP

## 2022-05-12 LAB — LIPID PANEL
Cholesterol: 123 mg/dL (ref <200)
HDL: 49 mg/dL (ref >=40)
LDL Cholesterol (Calc): 60 mg/dL (calc)
Non-HDL Cholesterol (Calc): 74 mg/dL (calc) (ref <130)
Total CHOL/HDL Ratio: 2.5 (calc) (ref <5.0)
Triglycerides: 60 mg/dL (ref <150)

## 2022-05-12 LAB — CBC WITH DIFFERENTIAL/PLATELET
Absolute Monocytes: 298 cells/uL (ref 200–950)
Basophils Absolute: 10 cells/uL (ref 0–200)
Basophils Relative: 0.3 %
Eosinophils Absolute: 70 cells/uL (ref 15–500)
Eosinophils Relative: 2.2 %
HCT: 40.2 % (ref 38.5–50.0)
Hemoglobin: 13.3 g/dL (ref 13.2–17.1)
Lymphs Abs: 1645 cells/uL (ref 850–3900)
MCH: 29.5 pg (ref 27.0–33.0)
MCHC: 33.1 g/dL (ref 32.0–36.0)
MCV: 89.1 fL (ref 80.0–100.0)
MPV: 9.9 fL (ref 7.5–12.5)
Monocytes Relative: 9.3 %
Neutro Abs: 1178 cells/uL — ABNORMAL LOW (ref 1500–7800)
Neutrophils Relative %: 36.8 %
Platelets: 255 10*3/uL (ref 140–400)
RBC: 4.51 10*6/uL (ref 4.20–5.80)
RDW: 12 % (ref 11.0–15.0)
Total Lymphocyte: 51.4 %
WBC: 3.2 10*3/uL — ABNORMAL LOW (ref 3.8–10.8)

## 2022-05-12 LAB — COMPLETE METABOLIC PANEL WITH GFR
AG Ratio: 2 (calc) (ref 1.0–2.5)
ALT: 13 U/L (ref 9–46)
AST: 16 U/L (ref 10–40)
Albumin: 4.7 g/dL (ref 3.6–5.1)
Alkaline phosphatase (APISO): 45 U/L (ref 36–130)
BUN: 10 mg/dL (ref 7–25)
CO2: 24 mmol/L (ref 20–32)
Calcium: 9.7 mg/dL (ref 8.6–10.3)
Chloride: 106 mmol/L (ref 98–110)
Creat: 0.98 mg/dL (ref 0.60–1.24)
Globulin: 2.3 g/dL (calc) (ref 1.9–3.7)
Glucose, Bld: 78 mg/dL (ref 65–99)
Potassium: 4.1 mmol/L (ref 3.5–5.3)
Sodium: 140 mmol/L (ref 135–146)
Total Bilirubin: 0.7 mg/dL (ref 0.2–1.2)
Total Protein: 7 g/dL (ref 6.1–8.1)
eGFR: 113 mL/min/{1.73_m2} (ref >=60)

## 2022-05-12 LAB — TSH: TSH: 1.18 mIU/L (ref 0.40–4.50)

## 2022-05-24 ENCOUNTER — Encounter: Payer: Self-pay | Admitting: Nurse Practitioner

## 2022-07-10 ENCOUNTER — Ambulatory Visit (INDEPENDENT_AMBULATORY_CARE_PROVIDER_SITE_OTHER): Payer: Medicaid Other | Admitting: Surgery

## 2022-07-10 ENCOUNTER — Encounter: Payer: Self-pay | Admitting: Surgery

## 2022-07-10 VITALS — BP 149/90 | HR 92 | Temp 98.9°F | Ht 67.0 in | Wt 126.8 lb

## 2022-07-10 DIAGNOSIS — K610 Anal abscess: Secondary | ICD-10-CM

## 2022-07-10 MED ORDER — AMOXICILLIN-POT CLAVULANATE 875-125 MG PO TABS
1.0000 | ORAL_TABLET | Freq: Two times a day (BID) | ORAL | 0 refills | Status: AC
Start: 2022-07-10 — End: 2022-07-17

## 2022-07-10 NOTE — Patient Instructions (Addendum)
We will prescribe you Augmentin to cover any possible infection.  Follow-up with our office as needed.  Please call and ask to speak with a nurse if you develop questions or concerns.   Do sitz baths several times a day especially after bowel movements.   How to Take a [employer] A sitz bath is a warm water bath that may be used to care for your rectum, genital area, or the area between your rectum and genitals (perineum). In a sitz bath, the water only comes up to your hips and covers your buttocks. A sitz bath may be done in a bathtub or with a portable sitz bath that fits over the toilet. Your health care provider may recommend a sitz bath to help: Relieve pain and discomfort after delivering a baby. Relieve pain and itching from hemorrhoids or anal fissures. Relieve pain after certain surgeries. Relax muscles that are sore or tight. How to take a sitz bath Take 2-4 sitz baths a day, or as many as told by your health care provider. Bathtub sitz bath To take a sitz bath in a bathtub: Partially fill a bathtub with warm water. The water should be deep enough to cover your hips and buttocks when you are sitting in the bathtub. Follow your health care provider's instructions if you are told to put medicine in the water. Sit in the water. Open the bathtub drain a little, and leave it open during your bath. Turn on the warm water again, enough to replace the water that is draining out. Keep the water running throughout your bath. This helps keep the water at the right level and temperature. Soak in the water for 15-20 minutes, or as long as told by your health care provider. When you are done, be careful when you stand up. You may feel dizzy. After the sitz bath, pat yourself dry. Do not rub your skin to dry it.  Over-the-toilet sitz bath To take a sitz bath with an over-the-toilet basin: Follow the manufacturer's instructions. Fill the basin with warm water. Follow your health care  provider's instructions if you were told to put medicine in the water. Sit on the seat. Make sure the water covers your buttocks and perineum. Soak in the water for 15-20 minutes, or as long as told by your health care provider. After the sitz bath, pat yourself dry. Do not rub your skin to dry it. Clean and dry the basin between uses. Discard the basin if it cracks, or according to the manufacturer's instructions.  Contact a health care provider if: Your pain or itching gets worse. Stop doing sitz baths if your symptoms get worse. You have new symptoms. Stop doing sitz baths until you talk with your health care provider. Summary A sitz bath is a warm water bath in which the water only comes up to your hips and covers your buttocks. Your health care provider may recommend a sitz bath to help relieve pain and discomfort after delivering a baby, relieve pain and itching from hemorrhoids or anal fissures, relieve pain after certain surgeries, or help to relax muscles that are sore or tight. Take 2-4 sitz baths a day, or as many as told by your health care provider. Soak in the water for 15-20 minutes. Stop doing sitz baths if your symptoms get worse. This information is not intended to replace advice given to you by your health care provider. Make sure you discuss any questions you have with your health care provider. Document Revised:  12/13/2021 Document Reviewed: 12/13/2021 Elsevier Patient Education  2023 [employer].

## 2022-07-10 NOTE — Progress Notes (Signed)
07/10/2022  Reason for Visit:  Perianal abscess  History of Present Illness: Todd Oneill is a 21 y.o. male presenting for evaluation of drainage from the perianal area.  He has a history of prior perianal abscess, s/p I&D with Dr. Lysle Pearl in 04/2021.  He reports this was on the right perianal area.  However, he has developed this year an area of pain/drainage in the left perianal area.  He reports it's cycling between flare up, drainage, healing, and then flare up again.  The drainage has some blood.  Currently he's not having any pain or drainage.  He has not had antibiotics since his I&D last year.  Denies any pain with bowel movements and denies blood in the stool.    Of note, patient was seen in the ED on 01/15/22 for rectal pain and CT scan was done which did not show an abscess  Past Medical History: Past Medical History:  Diagnosis Date   Heartburn      Past Surgical History: Past Surgical History:  Procedure Laterality Date   INCISION AND DRAINAGE ABSCESS N/A 05/19/2021   Procedure: INCISION AND DRAINAGE ABSCESS;  Surgeon: Benjamine Sprague, DO;  Location: [facility] ORS;  Service: General;  Laterality: N/A;    Home Medications: Prior to Admission medications   Medication Sig Start Date End Date Taking? Authorizing Provider  amoxicillin-clavulanate (AUGMENTIN) 875-125 MG tablet Take 1 tablet by mouth 2 (two) times daily for 7 days. 07/10/22 07/17/22 Yes Homero Hyson, Jacqulyn Bath, MD  pantoprazole (PROTONIX) 40 MG tablet Take 1 tablet (40 mg total) by mouth daily for 14 days. 01/15/22 01/29/22  Duffy Bruce, MD    Allergies: No Known Allergies  Social History:  reports that he has never smoked. He has quit using smokeless tobacco. He reports that he does not currently use alcohol. He reports current drug use. Drug: Marijuana.   Family History: Family History  Problem Relation Age of Onset   Diabetes Paternal Grandmother     Review of Systems: Review of Systems  Constitutional:  Negative  for chills and fever.  Respiratory:  Negative for shortness of breath.   Cardiovascular:  Negative for chest pain.  Gastrointestinal:  Negative for abdominal pain, nausea and vomiting.  Genitourinary:  Negative for dysuria.  Skin:  Negative for rash.    Physical Exam BP (!) 149/90   Pulse 92   Temp 98.9 F (37.2 C)   Ht _0  (1.702 m)   Wt 126 lb 12.8 oz (57.5 kg)   SpO2 99%   BMI 19.86 kg/m  CONSTITUTIONAL: No acute distress, well nourished. HEENT:  Normocephalic, atraumatic, extraocular motion intact. RESPIRATORY:  Normal respiratory effort without pathologic use of accessory muscles. CARDIOVASCULAR: Regular rhythm and rate. RECTAL:  External exam reveals a small 1-2 mm wound in the left posterior perianal area, about 1 cm from anal verge.  No significant induration or erythema or drainage and is not tender currently.  Digital rectal exam reveals scar tissue in the same direction of the anal canal, at the level of the sphincter muscle. MUSCULOSKELETAL:  Normal gait, no peripheral edema. NEUROLOGIC:  Motor and sensation is grossly normal.  Cranial nerves are grossly intact. PSYCH:  Alert and oriented to person, place and time. Affect is normal.  Laboratory Analysis: Labs from 05/11/22: Na 140, K 4.1, Cl 106, CO2 24, BUN 10, Cr 0.98.  Total bili 0.7, AST 16, ALT 13, Alk Phos 45.  WBC 3.2, Hgb 13.3, Hct 40.2, Plt 255  Imaging: CT abdomen/pelvis 01/15/22:  IMPRESSION: No perirectal fluid collection/abscess on CT.   No colonic wall thickening or inflammatory changes.  Assessment and Plan: This is a 21 y.o. male with history of perianal abscess.  --Discussed with the patient the possibility of having either a separate perianal abscess or an abscess that is being fed by a possible fistula in ano.  On exam there is some scar tissue at the level of the sphincter muscles, could be suspicious for a transphincteric fistula.  At this point, he's currently asymptomatic without any drainage  or pain and no I&D is needed for now.  As a precaution however, will give him a course of Augmentin.  Discussed with him that if this flares up again, we can evaluate him again and if he needs an I&D, could do it in the OR to better evaluate for a fistula.  If he does have a fistula, depending on the type/location, may need referral to colorectal surgery. --Follow up as needed.  I spent 30 minutes dedicated to the care of this patient on the date of this encounter to include pre-visit review of records, face-to-face time with the patient discussing diagnosis and management, and any post-visit coordination of care.   Melvyn Neth, [facility]

## 2022-08-31 ENCOUNTER — Emergency Department
Admission: EM | Admit: 2022-08-31 | Discharge: 2022-08-31 | Disposition: A | Discharge status: Home / Self Care | Payer: Medicaid Other | Attending: Emergency Medicine | Admitting: Emergency Medicine

## 2022-08-31 ENCOUNTER — Other Ambulatory Visit: Payer: Self-pay

## 2022-08-31 ENCOUNTER — Encounter: Payer: Self-pay | Admitting: Emergency Medicine

## 2022-08-31 DIAGNOSIS — R509 Fever, unspecified: Secondary | ICD-10-CM | POA: Diagnosis present

## 2022-08-31 DIAGNOSIS — Z20822 Contact with and (suspected) exposure to covid-19: Secondary | ICD-10-CM | POA: Diagnosis not present

## 2022-08-31 DIAGNOSIS — B349 Viral infection, unspecified: Secondary | ICD-10-CM | POA: Insufficient documentation

## 2022-08-31 LAB — CBC WITH DIFFERENTIAL/PLATELET
Abs Immature Granulocytes: 0.01 10*3/uL (ref 0.00–0.07)
Basophils Absolute: 0 10*3/uL (ref 0.0–0.1)
Basophils Relative: 0 %
Eosinophils Absolute: 0 10*3/uL (ref 0.0–0.5)
Eosinophils Relative: 0 %
HCT: 41.2 % (ref 39.0–52.0)
Hemoglobin: 13.2 g/dL (ref 13.0–17.0)
Immature Granulocytes: 0 %
Lymphocytes Relative: 27 %
Lymphs Abs: 1.1 10*3/uL (ref 0.7–4.0)
MCH: 28.3 pg (ref 26.0–34.0)
MCHC: 32 g/dL (ref 30.0–36.0)
MCV: 88.2 fL (ref 80.0–100.0)
Monocytes Absolute: 0.4 10*3/uL (ref 0.1–1.0)
Monocytes Relative: 10 %
Neutro Abs: 2.4 10*3/uL (ref 1.7–7.7)
Neutrophils Relative %: 63 %
Platelets: 197 10*3/uL (ref 150–400)
RBC: 4.67 MIL/uL (ref 4.22–5.81)
RDW: 12.1 % (ref 11.5–15.5)
WBC: 3.8 10*3/uL — ABNORMAL LOW (ref 4.0–10.5)
nRBC: 0 % (ref 0.0–0.2)

## 2022-08-31 LAB — GROUP A STREP BY PCR: Group A Strep by PCR: NOT DETECTED

## 2022-08-31 LAB — RESP PANEL BY RT-PCR (FLU A&B, COVID) ARPGX2
Influenza A by PCR: NEGATIVE
Influenza B by PCR: NEGATIVE
SARS Coronavirus 2 by RT PCR: NEGATIVE

## 2022-08-31 LAB — BASIC METABOLIC PANEL
Anion gap: 9 (ref 5–15)
BUN: 13 mg/dL (ref 6–20)
CO2: 22 mmol/L (ref 22–32)
Calcium: 9 mg/dL (ref 8.9–10.3)
Chloride: 101 mmol/L (ref 98–111)
Creatinine, Ser: 0.99 mg/dL (ref 0.61–1.24)
GFR, Estimated: >60 mL/min (ref >60)
Glucose, Bld: 106 mg/dL — ABNORMAL HIGH (ref 70–99)
Potassium: 3.6 mmol/L (ref 3.5–5.1)
Sodium: 132 mmol/L — ABNORMAL LOW (ref 135–145)

## 2022-08-31 MED ORDER — ACETAMINOPHEN 325 MG PO TABS
650.0000 mg | ORAL_TABLET | Freq: Once | ORAL | Status: AC
  Administered 2022-08-31: 650 mg via ORAL
  Filled 2022-08-31: qty 2

## 2022-08-31 MED ORDER — ONDANSETRON 4 MG PO TBDP: 4.0000 mg | ORAL_TABLET | Freq: Three times a day (TID) | ORAL | 0 refills | Status: DC | PRN

## 2022-08-31 NOTE — ED Notes (Signed)
See triage note  Presents with headache  low grade temp and sore throat  Was seen yesterday at [facility]  had negative COVID test  Developed vomiting today

## 2022-08-31 NOTE — Discharge Instructions (Signed)
Follow-up with your primary care provider or urgent care if any continued problems or concerns.  Increase fluids to stay hydrated especially clear fluids such as apple juice, Sprite, 7-Up, Gatorade, and Jell-O.  Tylenol or ibuprofen as needed for body aches, fever, headache.  A prescription for Zofran was sent to your pharmacy to use every 8 hours if needed for nausea and vomiting.  Return to the emergency department if any severe worsening of your symptoms or urgent concerns.

## 2022-08-31 NOTE — ED Provider Notes (Signed)
[facility] Provider Note    Event Date/Time   First MD Initiated Contact with Patient 08/31/22 1428     (approximate)   History   Headache and Fever   HPI  Todd Oneill is a 21 y.o. male   presents to the ED with complaint of headache, fever, cough for few days.  Patient had vomiting this morning but denies diarrhea.  Patient also states that his temperature has been high as 101 at home.  He is unaware of any known COVID exposure.  He states that he was seen at an urgent care yesterday where a COVID test was negative.      Physical Exam   Triage Vital Signs: ED Triage Vitals  Enc Vitals Group     BP 08/31/22 1256 (!) 158/92     Pulse Rate 08/31/22 1256 87     Resp 08/31/22 1256 18     Temp 08/31/22 1256 100 F (37.8 C)     Temp Source 08/31/22 1256 Oral     SpO2 08/31/22 1256 99 %     Weight 08/31/22 1254 127 lb (57.6 kg)     Height 08/31/22 1254 5\' 7"  (1.702 m)     Head Circumference --      Peak Flow --      Pain Score 08/31/22 1253 10     Pain Loc --      Pain Edu? --      Excl. in GC? --     Most recent vital signs: Vitals:   08/31/22 1256  BP: (!) 158/92  Pulse: 87  Resp: 18  Temp: 100 F (37.8 C)  SpO2: 99%     General: Awake, no distress.  CV:  Good peripheral perfusion.  Resp:  Normal effort.  Lungs are clear bilaterally. Abd:  No distention.  Soft, nontender, bowel sounds present x 4 quadrants. Other:  Posterior pharynx without erythema or exudate present.  Oral mucosa is moist.   ED Results / Procedures / Treatments   Labs (all labs ordered are listed, but only abnormal results are displayed) Labs Reviewed  BASIC METABOLIC PANEL - Abnormal; Notable for the following components:      Result Value   Sodium 132 (*)    Glucose, Bld 106 (*)    All other components within normal limits  CBC WITH DIFFERENTIAL/PLATELET - Abnormal; Notable for the following components:   WBC 3.8 (*)    All other components within  normal limits  GROUP A STREP BY PCR  RESP PANEL BY RT-PCR (FLU A&B, COVID) [id number]      PROCEDURES:  Critical Care performed:   Procedures   MEDICATIONS ORDERED IN ED: Medications  acetaminophen (TYLENOL) tablet 650 mg (650 mg Oral Given 08/31/22 1314)     IMPRESSION / MDM / ASSESSMENT AND PLAN / ED COURSE  I reviewed the triage vital signs and the nursing notes.   Differential diagnosis includes, but is not limited to, COVID, influenza, viral illness, upper respiratory infection.  21 year old male presents to the ED with complaint of headache and fever.  Patient was seen in urgent care yesterday where he was told that his COVID test was negative.  He has developed some nausea and vomiting today with decreased p.o. intake.  Physical exam is benign and BC low at 3.8 suggestive of a viral process with COVID, influenza and strep being negative.  BMP shows sodium minimally decreased at 132 and otherwise normal.  Patient was given Tylenol  while out in triage and no vomiting has occurred while in the ED.  He is encouraged to drink clear fluids.  A prescription for Zofran was sent to his pharmacy and a note for him to remain out of work.  We discussed course of viral illnesses and strongly encouraged to drink fluids and continue with Tylenol or ibuprofen as needed for body aches and fever.      Patient's presentation is most consistent with acute complicated illness / injury requiring diagnostic workup.  FINAL CLINICAL IMPRESSION(S) / ED DIAGNOSES   Final diagnoses:  Viral illness     Rx / DC Orders   ED Discharge Orders          Ordered    ondansetron (ZOFRAN-ODT) 4 MG disintegrating tablet  Every 8 hours PRN        08/31/22 1544             Note:  This document was prepared using Dragon voice recognition software and may include unintentional dictation errors.   Johnn Hai, PA-C 08/31/22 1552    Arta Silence, MD 08/31/22 1744

## 2022-08-31 NOTE — ED Triage Notes (Signed)
Pt via POV from home. Pt c/o headache and fever. States he went to [facility] yesterday and they gave him a shot of toradol with no relief. Denies cough or congestion. Pt has a hx of migraines.Pt is A&Ox4 and NAD.

## 2022-08-31 NOTE — ED Provider Triage Note (Signed)
Emergency Medicine Provider Triage Evaluation Note  Lacretia Nicks, a 21 y.o. male  was evaluated in triage.  Pt complains of low grade fever and headaches.  Was seen at local urgent care yesterday and tested negative for COVID, flu, and RSV.  Review of Systems  Positive: Headache, fatigue Negative: NVD  Physical Exam  BP (!) 158/92 (BP Location: Right Arm)   Pulse 87   Temp 100 F (37.8 C) (Oral)   Resp 18   Ht 5\' 7"  (1.702 m)   Wt 57.6 kg   SpO2 99%   BMI 19.89 kg/m  Gen:   Awake, no distress  NAD Resp:  Normal effort CTA MSK:   Moves extremities without difficulty  Other:    Medical Decision Making  Medically screening exam initiated at 1:04 PM.  Appropriate orders placed.  SAADIQ POCHE was informed that the remainder of the evaluation will be completed by another provider, this initial triage assessment does not replace that evaluation, and the importance of remaining in the ED until their evaluation is complete.  Patient to the ED for evaluation of headache and intermittent fevers.  Denies any cough, congestion, chest pain, shortness of breath.   Lacretia Nicks, PA-C 08/31/22 1311

## 2022-09-08 ENCOUNTER — Ambulatory Visit (INDEPENDENT_AMBULATORY_CARE_PROVIDER_SITE_OTHER): Payer: Medicaid Other | Admitting: Surgery

## 2022-09-08 ENCOUNTER — Other Ambulatory Visit: Payer: Self-pay

## 2022-09-08 ENCOUNTER — Encounter: Payer: Self-pay | Admitting: Surgery

## 2022-09-08 VITALS — BP 135/86 | HR 62 | Temp 98.5°F | Ht 66.0 in | Wt 119.0 lb

## 2022-09-08 DIAGNOSIS — K603 Anal fistula: Secondary | ICD-10-CM | POA: Diagnosis not present

## 2022-09-08 DIAGNOSIS — K610 Anal abscess: Secondary | ICD-10-CM

## 2022-09-08 NOTE — Progress Notes (Unsigned)
09/08/2022  History of Present Illness: Todd Oneill is a 21 y.o. male with history of perianal abscess s/p I&D of two sites by Dr. Tonna Oneill on 05/19/21.  I saw him last with an area of pain in the left perianal area on 07/10/22.  At the time of his visit he was not having any drainage and the flareup had subsided.  He was given a course of Augmentin as a precaution.  He reports that since then he's had flareups back and forth.  The area in the left will become larger/swollen and with more tenderness and then subside on its own.  Denies any drainage during these episodes.  He had a flareup recently and tried to get an appointment earlier, but today, the area is improving again and his swelling is resolving and the tenderness is better.  He reports having some fevers/chills, but he's also recovering from an illness.  Denies any diarrhea.  Past Medical History: Past Medical History:  Diagnosis Date   Heartburn      Past Surgical History: Past Surgical History:  Procedure Laterality Date   INCISION AND DRAINAGE ABSCESS N/A 05/19/2021   Procedure: INCISION AND DRAINAGE ABSCESS;  Surgeon: Todd Amabile, DO;  Location: [facility] ORS;  Service: General;  Laterality: N/A;    Home Medications: Prior to Admission medications   Medication Sig Start Date End Date Taking? Authorizing Provider  pantoprazole (PROTONIX) 40 MG tablet Take 1 tablet (40 mg total) by mouth daily for 14 days. 01/15/22 01/29/22  Todd Pollack, MD    Allergies: No Known Allergies  Review of Systems: Review of Systems  Constitutional:  Positive for chills and fever.  Respiratory:  Negative for shortness of breath.   Cardiovascular:  Negative for chest pain.  Gastrointestinal:  Negative for abdominal pain, nausea and vomiting.       Left perianal pain    Physical Exam BP 135/86   Pulse 62   Temp 98.5 F (36.9 C) (Oral)   Ht 5\' 6"  (1.676 m)   Wt 119 lb (54 kg)   SpO2 100%   BMI 19.21 kg/m  CONSTITUTIONAL: No acute  distress, well nourished. HEENT:  Normocephalic, atraumatic, extraocular motion intact. RESPIRATORY:  Normal respiratory effort without pathologic use of accessory muscles. CARDIOVASCULAR: Regular rhythm and rate. RECTAL: External exam reveals no open wounds or areas of swelling.  There is no erythema or induration.  In the left perianal area at the site of prior I&D scar, the patient reports some tenderness there.  However there is no fluctuance at this point.  On digital rectal exam, there is an area of scar tissue palpable in left anal canal area.  This seems to be proximal to the sphincter muscle. NEUROLOGIC:  Motor and sensation is grossly normal.  Cranial nerves are grossly intact. PSYCH:  Alert and oriented to person, place and time. Affect is normal.   Assessment and Plan: This is a 21 y.o. 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  - Discussed with patient that currently there is no evidence of residual infection.  There is no open wounds or drainage at this point.  Given the frequency and recurrence of the symptoms, he may be having a potential fistula forming.  As a precaution, we will refer him to colorectal surgery for further evaluation. - All of his questions have been answered. - Follow-up with 21 as needed.  I spent 20 minutes dedicated to the care of this patient on the date of this encounter to  include pre-visit review of records, face-to-face time with the patient discussing diagnosis and management, and any post-visit coordination of care.   Todd Oneill, [facility]

## 2022-09-08 NOTE — Patient Instructions (Signed)
Referral sent to [facility] Colorectal surgeon. Someone from their office will contact you. If you do not hear from her office within 5-7 days please feel free to call her office to expedite your appointment scheduling.

## 2022-09-10 ENCOUNTER — Encounter: Payer: Self-pay | Admitting: Surgery

## 2022-09-11 ENCOUNTER — Telehealth: Payer: Self-pay

## 2022-09-11 NOTE — Telephone Encounter (Signed)
Referral faxed to Romie Levee. Faxed office notes and demographics.

## 2022-10-08 NOTE — Patient Instructions (Signed)

## 2022-10-12 ENCOUNTER — Ambulatory Visit (INDEPENDENT_AMBULATORY_CARE_PROVIDER_SITE_OTHER): Payer: Medicaid Other | Admitting: Nurse Practitioner

## 2022-10-12 ENCOUNTER — Encounter: Payer: Self-pay | Admitting: Nurse Practitioner

## 2022-10-12 VITALS — BP 132/82 | HR 67 | Temp 97.7°F | Ht 68.11 in | Wt 130.3 lb

## 2022-10-12 DIAGNOSIS — K610 Anal abscess: Secondary | ICD-10-CM | POA: Diagnosis not present

## 2022-10-12 DIAGNOSIS — Z7689 Persons encountering health services in other specified circumstances: Secondary | ICD-10-CM | POA: Diagnosis not present

## 2022-10-12 DIAGNOSIS — D72818 Other decreased white blood cell count: Secondary | ICD-10-CM | POA: Diagnosis not present

## 2022-10-12 DIAGNOSIS — D72819 Decreased white blood cell count, unspecified: Secondary | ICD-10-CM | POA: Insufficient documentation

## 2022-10-12 NOTE — Progress Notes (Signed)
New Patient Office Visit  Subjective    Patient ID: Todd Oneill, male    DOB: 08/29/2001  Age: 22 y.o. MRN: [id number]  CC:  Chief Complaint  Patient presents with   New Patient (Initial Visit)    HPI Todd Oneill presents for new patient visit to establish care.  Introduced to Publishing rights manager role and practice setting.  All questions answered.  Discussed provider/patient relationship and expectations.   Has history of perianal abscess that started in April 2023 -- sees general surgery for this and drainage as reoccurs.       10/12/2022    9:18 AM 05/11/2022   12:00 PM  Depression screen PHQ 2/9  Decreased Interest 0 0  Down, Depressed, Hopeless 2 0  PHQ - 2 Score 2 0  Altered sleeping 0   Tired, decreased energy 0   Change in appetite 0   Feeling bad or failure about yourself  2   Trouble concentrating 0   Moving slowly or fidgety/restless 0   Suicidal thoughts 0   PHQ-9 Score 4   Difficult doing work/chores Not difficult at all        10/12/2022    9:18 AM 05/11/2022   12:00 PM  GAD 7 : Generalized Anxiety Score  Nervous, Anxious, on Edge 0 0  Control/stop worrying 0 0  Worry too much - different things 0 0  Trouble relaxing 0 0  Restless 0 0  Easily annoyed or irritable 3 0  Afraid - awful might happen 0 0  Total GAD 7 Score 3 0  Anxiety Difficulty Not difficult at all Not difficult at all   Outpatient Encounter Medications as of 10/12/2022  Medication Sig   [DISCONTINUED] pantoprazole (PROTONIX) 40 MG tablet Take 1 tablet (40 mg total) by mouth daily for 14 days.   No facility-administered encounter medications on file as of 10/12/2022.    Past Medical History:  Diagnosis Date   Heartburn     Past Surgical History:  Procedure Laterality Date   INCISION AND DRAINAGE ABSCESS N/A 05/19/2021   Procedure: INCISION AND DRAINAGE ABSCESS;  Surgeon: Sung Amabile, DO;  Location: [facility] ORS;  Service: General;  Laterality: N/A;    Family History   Problem Relation Age of Onset   GER disease Mother    Diabetes Paternal Grandmother    Diabetes Mellitus II Paternal Grandmother     Social History   Socioeconomic History   Marital status: Single    Spouse name: Not on file   Number of children: Not on file   Years of education: Not on file   Highest education level: Not on file  Occupational History   Not on file  Tobacco Use   Smoking status: Never   Smokeless tobacco: Former  Building services engineer Use: Never used  Substance and Sexual Activity   Alcohol use: Not Currently   Drug use: Yes    Types: Marijuana   Sexual activity: Not Currently  Other Topics Concern   Not on file  Social History Narrative   Works at [employer].   Social Determinants of Health   Financial Resource Strain: Low Risk  (10/12/2022)   Overall Financial Resource Strain (CARDIA)    Difficulty of Paying Living Expenses: Not hard at all  Food Insecurity: No Food Insecurity (10/12/2022)   Hunger Vital Sign    Worried About Running Out of Food in the Last Year: Never true    Ran Out of Food in  the Last Year: Never true  Transportation Needs: No Transportation Needs (10/12/2022)   PRAPARE - Hydrologist (Medical): No    Lack of Transportation (Non-Medical): No  Physical Activity: Insufficiently Active (10/12/2022)   Exercise Vital Sign    Days of Exercise per Week: 3 days    Minutes of Exercise per Session: 30 min  Stress: Stress Concern Present (10/12/2022)   Alda    Feeling of Stress : To some extent  Social Connections: Socially Isolated (10/12/2022)   Social Connection and Isolation Panel [NHANES]    Frequency of Communication with Friends and Family: More than three times a week    Frequency of Social Gatherings with Friends and Family: More than three times a week    Attends Religious Services: Never    Marine scientist or Organizations:  No    Attends Archivist Meetings: Never    Marital Status: Never married  Intimate Partner Violence: Not At Risk (10/12/2022)   Humiliation, Afraid, Rape, and Kick questionnaire    Fear of Current or Ex-Partner: No    Emotionally Abused: No    Physically Abused: No    Sexually Abused: No    Review of Systems  Constitutional:  Negative for chills, diaphoresis, fever and weight loss.  Respiratory:  Negative for cough, shortness of breath and wheezing.   Cardiovascular:  Negative for chest pain, palpitations, orthopnea and leg swelling.  Neurological: Negative.   Endo/Heme/Allergies: Negative.   Psychiatric/Behavioral: Negative.        Objective    BP 132/82   Pulse 67   Temp 97.7 F (36.5 C) (Oral)   Ht 5' 8.11" (1.73 m)   Wt 130 lb 4.8 oz (59.1 kg)   SpO2 99%   BMI 19.75 kg/m   Physical Exam Vitals and nursing note reviewed.  Constitutional:      General: He is awake. He is not in acute distress.    Appearance: He is well-developed and well-groomed. He is not ill-appearing or toxic-appearing.  HENT:     Head: Normocephalic.  Eyes:     General: Lids are normal.     Extraocular Movements: Extraocular movements intact.     Conjunctiva/sclera: Conjunctivae normal.  Neck:     Thyroid: No thyromegaly.     Vascular: No carotid bruit.  Cardiovascular:     Rate and Rhythm: Normal rate and regular rhythm.     Heart sounds: Normal heart sounds.  Pulmonary:     Effort: No accessory muscle usage or respiratory distress.     Breath sounds: Normal breath sounds.  Abdominal:     General: Bowel sounds are normal. There is no distension.     Palpations: Abdomen is soft.     Tenderness: There is no abdominal tenderness.  Musculoskeletal:     Cervical back: Full passive range of motion without pain.     Right lower leg: No edema.     Left lower leg: No edema.  Lymphadenopathy:     Cervical: No cervical adenopathy.  Skin:    General: Skin is warm.     Capillary  Refill: Capillary refill takes less than 2 seconds.  Neurological:     Mental Status: He is alert and oriented to person, place, and time.     Deep Tendon Reflexes: Reflexes are normal and symmetric.     Reflex Scores:      Brachioradialis reflexes are 2+ on  the right side and 2+ on the left side.      Patellar reflexes are 2+ on the right side and 2+ on the left side. Psychiatric:        Attention and Perception: Attention normal.        Mood and Affect: Mood normal.        Speech: Speech normal.        Behavior: Behavior normal. Behavior is cooperative.        Thought Content: Thought content normal.       Latest Ref Rng & Units 08/31/2022    1:15 PM 05/11/2022    3:37 PM 01/15/2022    1:20 PM  CBC  WBC 4.0 - 10.5 K/uL 3.8  3.2  6.7   Hemoglobin 13.0 - 17.0 g/dL 13.2  13.3  12.9   Hematocrit 39.0 - 52.0 % 41.2  40.2  40.0   Platelets 150 - 400 K/uL 197  255  245        Latest Ref Rng & Units 08/31/2022    1:15 PM 05/11/2022    3:37 PM 01/15/2022    1:20 PM  CMP  Glucose 70 - 99 mg/dL 106  78  119   BUN 6 - 20 mg/dL 13  10  13    Creatinine 0.61 - 1.24 mg/dL 0.99  0.98  1.05   Sodium 135 - 145 mmol/L 132  140  136   Potassium 3.5 - 5.1 mmol/L 3.6  4.1  3.1   Chloride 98 - 111 mmol/L 101  106  106   CO2 22 - 32 mmol/L 22  24  23    Calcium 8.9 - 10.3 mg/dL 9.0  9.7  9.4   Total Protein 6.1 - 8.1 g/dL  7.0  7.3   Total Bilirubin 0.2 - 1.2 mg/dL  0.7  0.9   Alkaline Phos 38 - 126 U/L   42   AST 10 - 40 U/L  16  44   ALT 9 - 46 U/L  13  25        Assessment & Plan:   Problem List Items Addressed This Visit       Other   Perianal abscess - Primary    No current flares.  Follows with general surgery.  Higher risk for recurrence.      WBC decreased    Noted on recent labs -- monitor closely for trend and send to hematology as needed.        Other Visit Diagnoses     Encounter to establish care       New patient to office, introduced to setting and provider.        Return in about 7 months (around 05/14/2023) for Annual physical.   Venita Lick, NP

## 2022-10-12 NOTE — Assessment & Plan Note (Signed)
Noted on recent labs -- monitor closely for trend and send to hematology as needed.

## 2022-10-12 NOTE — Assessment & Plan Note (Signed)
No current flares.  Follows with general surgery.  Higher risk for recurrence.

## 2022-10-17 ENCOUNTER — Encounter: Payer: Self-pay | Admitting: Nurse Practitioner

## 2023-03-15 ENCOUNTER — Ambulatory Visit: Payer: Medicaid Other | Admitting: Nurse Practitioner

## 2023-05-13 NOTE — Patient Instructions (Signed)

## 2023-05-15 ENCOUNTER — Encounter: Payer: Self-pay | Admitting: Nurse Practitioner

## 2023-05-15 ENCOUNTER — Ambulatory Visit (INDEPENDENT_AMBULATORY_CARE_PROVIDER_SITE_OTHER): Payer: Medicaid Other | Admitting: Nurse Practitioner

## 2023-05-15 VITALS — BP 114/80 | HR 67 | Temp 98.4°F | Ht 67.7 in | Wt 121.4 lb

## 2023-05-15 DIAGNOSIS — Z136 Encounter for screening for cardiovascular disorders: Secondary | ICD-10-CM

## 2023-05-15 DIAGNOSIS — Z13220 Encounter for screening for lipoid disorders: Secondary | ICD-10-CM | POA: Diagnosis not present

## 2023-05-15 DIAGNOSIS — Z23 Encounter for immunization: Secondary | ICD-10-CM

## 2023-05-15 DIAGNOSIS — Z0000 Encounter for general adult medical examination without abnormal findings: Secondary | ICD-10-CM

## 2023-05-15 DIAGNOSIS — D72818 Other decreased white blood cell count: Secondary | ICD-10-CM | POA: Diagnosis not present

## 2023-05-15 NOTE — Assessment & Plan Note (Signed)
Noted on recent labs in hospital -- monitor closely for trend and send to hematology as needed. Labs today.  Denies any B symptoms.

## 2023-05-15 NOTE — Progress Notes (Signed)
BP 114/80   Pulse 67   Temp 98.4 F (36.9 C) (Oral)   Ht 5' 7.7" (1.72 m)   Wt 121 lb 6.4 oz (55.1 kg)   SpO2 98%   BMI 18.62 kg/m    Subjective:    Patient ID: Todd Oneill, male    DOB: 2001/08/17, 22 y.o.   MRN: [id number]  HPI: Todd Oneill is a 22 y.o. male presenting on 05/15/2023 for comprehensive medical examination. Current medical complaints include:none  He currently lives with: mother Interim Problems from his last visit: no  Functional Status Survey: Is the patient deaf or have difficulty hearing?: No Does the patient have difficulty seeing, even when wearing glasses/contacts?: No Does the patient have difficulty concentrating, remembering, or making decisions?: No Does the patient have difficulty walking or climbing stairs?: No Does the patient have difficulty dressing or bathing?: No Does the patient have difficulty doing errands alone such as visiting a doctor's office or shopping?: No  Depression Screen    05/15/2023    9:09 AM 10/12/2022    9:18 AM 05/11/2022   12:00 PM  Depression screen PHQ 2/9  Decreased Interest 0 0 0  Down, Depressed, Hopeless 1 2 0  PHQ - 2 Score 1 2 0  Altered sleeping 0 0   Tired, decreased energy 0 0   Change in appetite 0 0   Feeling bad or failure about yourself  1 2   Trouble concentrating 0 0   Moving slowly or fidgety/restless 0 0   Suicidal thoughts 0 0   PHQ-9 Score 2 4   Difficult doing work/chores Somewhat difficult Not difficult at all       05/15/2023    9:09 AM 10/12/2022    9:18 AM 05/11/2022   12:00 PM  GAD 7 : Generalized Anxiety Score  Nervous, Anxious, on Edge 1 0 0  Control/stop worrying 0 0 0  Worry too much - different things 1 0 0  Trouble relaxing 0 0 0  Restless 0 0 0  Easily annoyed or irritable 3 3 0  Afraid - awful might happen 0 0 0  Total GAD 7 Score 5 3 0  Anxiety Difficulty Somewhat difficult Not difficult at all Not difficult at all      01/15/2022    1:34 AM 03/18/2022   10:43  AM 05/11/2022   12:00 PM 08/31/2022   12:55 PM 05/15/2023    9:08 AM  Fall Risk  Falls in the past year?   0  0  Was there an injury with Fall?   0  0  Fall Risk Category Calculator   0  0  Fall Risk Category (Retired)   Low    (RETIRED) Patient Fall Risk Level Low fall risk Low fall risk Low fall risk Low fall risk   Patient at Risk for Falls Due to   No Fall Risks  No Fall Risks  Fall risk Follow up   Falls evaluation completed  Falls evaluation completed    Past Medical History:  Past Medical History:  Diagnosis Date   Heartburn     Surgical History:  Past Surgical History:  Procedure Laterality Date   INCISION AND DRAINAGE ABSCESS N/A 05/19/2021   Procedure: INCISION AND DRAINAGE ABSCESS;  Surgeon: Sung Amabile, DO;  Location: [facility] ORS;  Service: General;  Laterality: N/A;    Medications:  No current outpatient medications on file prior to visit.   No current facility-administered medications on file prior  to visit.   Allergies:  No Known Allergies  Social History:  Social History   Socioeconomic History   Marital status: Single    Spouse name: Not on file   Number of children: Not on file   Years of education: Not on file   Highest education level: Not on file  Occupational History   Not on file  Tobacco Use   Smoking status: Never   Smokeless tobacco: Former  Advertising account planner   Vaping status: Never Used  Substance and Sexual Activity   Alcohol use: Not Currently   Drug use: Yes    Types: Marijuana   Sexual activity: Not Currently  Other Topics Concern   Not on file  Social History Narrative   Works at [employer].   Social Determinants of Health   Financial Resource Strain: Low Risk  (10/12/2022)   Overall Financial Resource Strain (CARDIA)    Difficulty of Paying Living Expenses: Not hard at all  Food Insecurity: No Food Insecurity (10/12/2022)   Hunger Vital Sign    Worried About Running Out of Food in the Last Year: Never true    Ran Out of Food in the  Last Year: Never true  Transportation Needs: No Transportation Needs (10/12/2022)   PRAPARE - Administrator, Civil Service (Medical): No    Lack of Transportation (Non-Medical): No  Physical Activity: Insufficiently Active (10/12/2022)   Exercise Vital Sign    Days of Exercise per Week: 3 days    Minutes of Exercise per Session: 30 min  Stress: No Stress Concern Present (05/15/2023)   [employer] of Occupational Health - Occupational Stress Questionnaire    Feeling of Stress : Only a little  Social Connections: Socially Isolated (10/12/2022)   Social Connection and Isolation Panel [NHANES]    Frequency of Communication with Friends and Family: More than three times a week    Frequency of Social Gatherings with Friends and Family: More than three times a week    Attends Religious Services: Never    Database administrator or Organizations: No    Attends Banker Meetings: Never    Marital Status: Never married  Intimate Partner Violence: Not At Risk (10/12/2022)   Humiliation, Afraid, Rape, and Kick questionnaire    Fear of Current or Ex-Partner: No    Emotionally Abused: No    Physically Abused: No    Sexually Abused: No   Social History   Tobacco Use  Smoking Status Never  Smokeless Tobacco Former   Social History   Substance and Sexual Activity  Alcohol Use Not Currently    Family History:  Family History  Problem Relation Age of Onset   GER disease Mother    Diabetes Paternal Grandmother    Diabetes Mellitus II Paternal Grandmother     Past medical history, surgical history, medications, allergies, family history and social history reviewed with patient today and changes made to appropriate areas of the chart.   ROS All other ROS negative except what is listed above and in the HPI.      Objective:    BP 114/80   Pulse 67   Temp 98.4 F (36.9 C) (Oral)   Ht 5' 7.7" (1.72 m)   Wt 121 lb 6.4 oz (55.1 kg)   SpO2 98%   BMI 18.62  kg/m   Wt Readings from Last 3 Encounters:  05/15/23 121 lb 6.4 oz (55.1 kg)  10/12/22 130 lb 4.8 oz (59.1 kg)  09/08/22  119 lb (54 kg)    Physical Exam Vitals and nursing note reviewed.  Constitutional:      General: He is awake. He is not in acute distress.    Appearance: He is well-developed and well-groomed. He is not ill-appearing or toxic-appearing.  HENT:     Head: Normocephalic and atraumatic.     Right Ear: Hearing, tympanic membrane, ear canal and external ear normal. No drainage.     Left Ear: Hearing, tympanic membrane, ear canal and external ear normal. No drainage.     Nose: Nose normal.     Mouth/Throat:     Pharynx: Uvula midline.  Eyes:     General: Lids are normal.        Right eye: No discharge.        Left eye: No discharge.     Extraocular Movements: Extraocular movements intact.     Conjunctiva/sclera: Conjunctivae normal.     Pupils: Pupils are equal, round, and reactive to light.     Visual Fields: Right eye visual fields normal and left eye visual fields normal.  Neck:     Thyroid: No thyromegaly.     Vascular: No carotid bruit or JVD.     Trachea: Trachea normal.  Cardiovascular:     Rate and Rhythm: Normal rate and regular rhythm.     Heart sounds: Normal heart sounds, S1 normal and S2 normal. No murmur heard.    No gallop.  Pulmonary:     Effort: Pulmonary effort is normal. No accessory muscle usage or respiratory distress.     Breath sounds: Normal breath sounds.  Abdominal:     General: Bowel sounds are normal.     Palpations: Abdomen is soft. There is no hepatomegaly or splenomegaly.     Tenderness: There is no abdominal tenderness.  Musculoskeletal:        General: Normal range of motion.     Cervical back: Normal range of motion and neck supple.     Right lower leg: No edema.     Left lower leg: No edema.  Lymphadenopathy:     Head:     Right side of head: No submental, submandibular, tonsillar, preauricular or posterior auricular  adenopathy.     Left side of head: No submental, submandibular, tonsillar, preauricular or posterior auricular adenopathy.     Cervical: No cervical adenopathy.  Skin:    General: Skin is warm and dry.     Capillary Refill: Capillary refill takes less than 2 seconds.     Findings: No rash.  Neurological:     Mental Status: He is alert and oriented to person, place, and time.     Gait: Gait is intact.     Deep Tendon Reflexes: Reflexes are normal and symmetric.     Reflex Scores:      Brachioradialis reflexes are 2+ on the right side and 2+ on the left side.      Patellar reflexes are 2+ on the right side and 2+ on the left side. Psychiatric:        Attention and Perception: Attention normal.        Mood and Affect: Mood normal.        Speech: Speech normal.        Behavior: Behavior normal. Behavior is cooperative.        Thought Content: Thought content normal.        Cognition and Memory: Cognition normal.      Results for orders placed  or performed during the hospital encounter of 08/31/22  Group A Strep by PCR   Specimen: Throat; Sterile Swab  Result Value Ref Range   Group A Strep by PCR NOT DETECTED NOT DETECTED  Resp Panel by RT-PCR (Flu A&B, Covid) Anterior Nasal Swab   Specimen: Anterior Nasal Swab  Result Value Ref Range   SARS Coronavirus 2 by RT PCR NEGATIVE NEGATIVE   Influenza A by PCR NEGATIVE NEGATIVE   Influenza B by PCR NEGATIVE NEGATIVE  Basic metabolic panel  Result Value Ref Range   Sodium 132 (L) 135 - 145 mmol/L   Potassium 3.6 3.5 - 5.1 mmol/L   Chloride 101 98 - 111 mmol/L   CO2 22 22 - 32 mmol/L   Glucose, Bld 106 (H) 70 - 99 mg/dL   BUN 13 6 - 20 mg/dL   Creatinine, Ser 1.02 0.61 - 1.24 mg/dL   Calcium 9.0 8.9 - 72.5 mg/dL   GFR, Estimated >36 >64 mL/min   Anion gap 9 5 - 15  CBC with Differential  Result Value Ref Range   WBC 3.8 (L) 4.0 - 10.5 K/uL   RBC 4.67 4.22 - 5.81 MIL/uL   Hemoglobin 13.2 13.0 - 17.0 g/dL   HCT 40.3 47.4 - 25.9  %   MCV 88.2 80.0 - 100.0 fL   MCH 28.3 26.0 - 34.0 pg   MCHC 32.0 30.0 - 36.0 g/dL   RDW 56.3 87.5 - 64.3 %   Platelets 197 150 - 400 K/uL   nRBC 0.0 0.0 - 0.2 %   Neutrophils Relative % 63 %   Neutro Abs 2.4 1.7 - 7.7 K/uL   Lymphocytes Relative 27 %   Lymphs Abs 1.1 0.7 - 4.0 K/uL   Monocytes Relative 10 %   Monocytes Absolute 0.4 0.1 - 1.0 K/uL   Eosinophils Relative 0 %   Eosinophils Absolute 0.0 0.0 - 0.5 K/uL   Basophils Relative 0 %   Basophils Absolute 0.0 0.0 - 0.1 K/uL   Immature Granulocytes 0 %   Abs Immature Granulocytes 0.01 0.00 - 0.07 K/uL      Assessment & Plan:   Problem List Items Addressed This Visit       Other   WBC decreased - Primary    Noted on recent labs in hospital -- monitor closely for trend and send to hematology as needed. Labs today.  Denies any B symptoms.       Relevant Orders   CBC with Differential/Platelet   TSH   Other Visit Diagnoses     Encounter for lipid screening for cardiovascular disease       Lipid panel in office today.   Relevant Orders   Comprehensive metabolic panel   Lipid Panel w/o Chol/HDL Ratio   Encounter for annual physical exam       Annual physical today with labs and health maintenance reviewed, discussed with patient.       LABORATORY TESTING:  Health maintenance labs ordered today as discussed above.   IMMUNIZATIONS:   - Tdap: Tetanus vaccination status reviewed: will get next visit, wasn't prepared for it today. - Influenza: Refused - Pneumovax: Not applicable - Prevnar: Not applicable - Zostavax vaccine: Not applicable  SCREENING: - Colonoscopy: Not applicable  Discussed with patient purpose of the colonoscopy is to detect colon cancer at curable precancerous or early stages   - AAA Screening: Not applicable  -Hearing Test: Not applicable  -Spirometry: Not applicable   PATIENT COUNSELING:  Sexuality: Discussed sexually transmitted diseases, partner selection, use of condoms, avoidance  of unintended pregnancy  and contraceptive alternatives.   Advised to avoid cigarette smoking.  I discussed with the patient that most people either abstain from alcohol or drink within safe limits (<=14/week and <=4 drinks/occasion for males, <=7/weeks and <= 3 drinks/occasion for females) and that the risk for alcohol disorders and other health effects rises proportionally with the number of drinks per week and how often a drinker exceeds daily limits.  Discussed cessation/primary prevention of drug use and availability of treatment for abuse.   Diet: Encouraged to adjust caloric intake to maintain  or achieve ideal body weight, to reduce intake of dietary saturated fat and total fat, to limit sodium intake by avoiding high sodium foods and not adding table salt, and to maintain adequate dietary potassium and calcium preferably from fresh fruits, vegetables, and low-fat dairy products.    Stressed the importance of regular exercise  Injury prevention: Discussed safety belts, safety helmets, smoke detector, smoking near bedding or upholstery.   Dental health: Discussed importance of regular tooth brushing, flossing, and dental visits.   Follow up plan: NEXT PREVENTATIVE PHYSICAL DUE IN 1 YEAR. Return in about 1 year (around 05/14/2024) for Annual Physical .

## 2023-05-16 ENCOUNTER — Other Ambulatory Visit: Payer: Self-pay | Admitting: Nurse Practitioner

## 2023-05-16 DIAGNOSIS — D72818 Other decreased white blood cell count: Secondary | ICD-10-CM

## 2023-05-16 LAB — CBC WITH DIFFERENTIAL/PLATELET
Basophils Absolute: 0 10*3/uL (ref 0.0–0.2)
Basos: 0 %
EOS (ABSOLUTE): 0.1 10*3/uL (ref 0.0–0.4)
Eos: 3 %
Hematocrit: 41 % (ref 37.5–51.0)
Hemoglobin: 13.5 g/dL (ref 13.0–17.7)
Immature Grans (Abs): 0 10*3/uL (ref 0.0–0.1)
Immature Granulocytes: 0 %
Lymphocytes Absolute: 1.5 10*3/uL (ref 0.7–3.1)
Lymphs: 45 %
MCH: 28.8 pg (ref 26.6–33.0)
MCHC: 32.9 g/dL (ref 31.5–35.7)
MCV: 87 fL (ref 79–97)
Monocytes Absolute: 0.2 10*3/uL (ref 0.1–0.9)
Monocytes: 7 %
Neutrophils Absolute: 1.5 10*3/uL (ref 1.4–7.0)
Neutrophils: 45 %
Platelets: 243 10*3/uL (ref 150–450)
RBC: 4.69 x10E6/uL (ref 4.14–5.80)
RDW: 12.2 % (ref 11.6–15.4)
WBC: 3.3 10*3/uL — ABNORMAL LOW (ref 3.4–10.8)

## 2023-05-16 LAB — COMPREHENSIVE METABOLIC PANEL
ALT: 12 IU/L (ref 0–44)
AST: 15 IU/L (ref 0–40)
Albumin: 4.6 g/dL (ref 4.3–5.2)
Alkaline Phosphatase: 51 IU/L (ref 44–121)
BUN/Creatinine Ratio: 9 (ref 9–20)
BUN: 9 mg/dL (ref 6–20)
Bilirubin Total: 0.4 mg/dL (ref 0.0–1.2)
CO2: 24 mmol/L (ref 20–29)
Calcium: 9.5 mg/dL (ref 8.7–10.2)
Chloride: 104 mmol/L (ref 96–106)
Creatinine, Ser: 0.97 mg/dL (ref 0.76–1.27)
Globulin, Total: 2.3 g/dL (ref 1.5–4.5)
Glucose: 83 mg/dL (ref 70–99)
Potassium: 4.1 mmol/L (ref 3.5–5.2)
Sodium: 140 mmol/L (ref 134–144)
Total Protein: 6.9 g/dL (ref 6.0–8.5)
eGFR: 113 mL/min/{1.73_m2} (ref >59)

## 2023-05-16 LAB — LIPID PANEL W/O CHOL/HDL RATIO
Cholesterol, Total: 122 mg/dL (ref 100–199)
HDL: 56 mg/dL (ref >39)
LDL Chol Calc (NIH): 49 mg/dL (ref 0–99)
Triglycerides: 90 mg/dL (ref 0–149)
VLDL Cholesterol Cal: 17 mg/dL (ref 5–40)

## 2023-05-16 LAB — TSH: TSH: 2.68 u[IU]/mL (ref 0.450–4.500)

## 2023-05-16 NOTE — Progress Notes (Signed)
Contacted via MyChart -- lab visit in 3 months please   Good evening Todd Oneill, your labs have returned and overall everything looks great with exception of white blood cell count continuing to be mildly low.  I would like to recheck this in 3 months via an outpatient lab, if it remains low I may send you to hematology for further assessment.  Any questions?  My staff will call to schedule a lab only visit. Keep being amazing!!  Thank you for allowing me to participate in your care.  I appreciate you. Kindest regards, Vola Beneke

## 2023-05-17 NOTE — Progress Notes (Signed)
Called and scheduled patient for 3 month lab only visit on 08/17/2023 @ 9:00 am.

## 2023-07-26 ENCOUNTER — Ambulatory Visit: Payer: Medicaid Other | Admitting: Family Medicine

## 2023-08-17 ENCOUNTER — Other Ambulatory Visit: Payer: Medicaid Other

## 2024-01-22 ENCOUNTER — Encounter: Payer: Self-pay | Admitting: Nurse Practitioner

## 2024-01-23 ENCOUNTER — Ambulatory Visit (INDEPENDENT_AMBULATORY_CARE_PROVIDER_SITE_OTHER): Admitting: Nurse Practitioner

## 2024-01-23 VITALS — BP 126/77 | HR 64 | Temp 98.7°F | Ht 67.7 in | Wt 125.6 lb

## 2024-01-23 DIAGNOSIS — Z136 Encounter for screening for cardiovascular disorders: Secondary | ICD-10-CM | POA: Diagnosis not present

## 2024-01-23 DIAGNOSIS — R0789 Other chest pain: Secondary | ICD-10-CM | POA: Insufficient documentation

## 2024-01-23 DIAGNOSIS — R079 Chest pain, unspecified: Secondary | ICD-10-CM

## 2024-01-23 DIAGNOSIS — Z13220 Encounter for screening for lipoid disorders: Secondary | ICD-10-CM

## 2024-01-23 DIAGNOSIS — D72818 Other decreased white blood cell count: Secondary | ICD-10-CM | POA: Diagnosis not present

## 2024-01-23 MED ORDER — METHYLPREDNISOLONE 4 MG PO TBPK: ORAL_TABLET | ORAL | 0 refills | Status: AC

## 2024-01-23 NOTE — Progress Notes (Signed)
 BP 126/77   Pulse 64   Temp 98.7 F (37.1 C) (Oral)   Ht 5' 7.7" (1.72 m)   Wt 125 lb 9.6 oz (57 kg)   SpO2 99%   BMI 19.27 kg/m    Subjective:    Patient ID: Todd Oneill, male    DOB: 01-Jul-2001, 23 y.o.   MRN: [id number]  HPI: Todd Oneill is a 23 y.o. male  Chief Complaint  Patient presents with   Chest Pain    Patient states he has been having chest pains for the last few weeks. States he feels the pain more when he moves, especially when he is laying down    CHEST PAIN Presents today for complaint of chest pain over the past 3 weeks.  Reports feeling this more when lying down or moving.  Had gotten off work when started, then felt a sharp pain. Notices a lot with deep breathing.  Does vape, nicotine, for 2 years and occasional MJ.  Denies family history of MI, clots, or autoimmune disease.  Had a grandmother who had diabetes.  Denies any recent illnesses or injuries. Time since onset: 3 weeks ago Duration:weeks Onset: sudden Quality: sharp and aching Severity: 10/10 at worst Location: left para substernal Radiation: none Episode duration: constant Frequency: constant Related to exertion: yes Activity when pain started: lying down Trauma: no Anxiety/recent stressors: no Aggravating factors: lying down, pressing on area, and deep breathing Alleviating factors: nothing Status: worse Treatments attempted: nothing  Current pain status: in pain Shortness of breath: no Cough: no Nausea: no Diaphoresis: no Heartburn: yes -- gets this pretty frequently, used to take medication for this (Protonix ) -- although this current pain is different from heart burn pain Palpitations: no      01/23/2024    9:43 AM 05/15/2023    9:09 AM 10/12/2022    9:18 AM 05/11/2022   12:00 PM  Depression screen PHQ 2/9  Decreased Interest 0 0 0 0  Down, Depressed, Hopeless 1 1 2  0  PHQ - 2 Score 1 1 2  0  Altered sleeping 0 0 0   Tired, decreased energy 0 0 0   Change in appetite  3 0 0   Feeling bad or failure about yourself  1 1 2    Trouble concentrating 0 0 0   Moving slowly or fidgety/restless 0 0 0   Suicidal thoughts 0 0 0   PHQ-9 Score 5 2 4    Difficult doing work/chores Not difficult at all Somewhat difficult Not difficult at all        01/23/2024   10:10 AM 05/15/2023    9:09 AM 10/12/2022    9:18 AM 05/11/2022   12:00 PM  GAD 7 : Generalized Anxiety Score  Nervous, Anxious, on Edge 1 1 0 0  Control/stop worrying 1 0 0 0  Worry too much - different things 1 1 0 0  Trouble relaxing 0 0 0 0  Restless 0 0 0 0  Easily annoyed or irritable 1 3 3  0  Afraid - awful might happen 0 0 0 0  Total GAD 7 Score 4 5 3  0  Anxiety Difficulty Not difficult at all Somewhat difficult Not difficult at all Not difficult at all      Relevant past medical, surgical, family and social history reviewed and updated as indicated. Interim medical history since our last visit reviewed. Allergies and medications reviewed and updated.  Review of Systems  Constitutional:  Negative for activity change,  appetite change, diaphoresis, fatigue and fever.  Respiratory:  Negative for cough, chest tightness, shortness of breath and wheezing.   Cardiovascular:  Positive for chest pain. Negative for palpitations and leg swelling.  Gastrointestinal: Negative.   Endocrine: Negative for cold intolerance and heat intolerance.  Musculoskeletal:  Negative for neck pain.  Skin:  Negative for rash.  Neurological: Negative.   Psychiatric/Behavioral: Negative.      Per HPI unless specifically indicated above     Objective:    BP 126/77   Pulse 64   Temp 98.7 F (37.1 C) (Oral)   Ht 5' 7.7" (1.72 m)   Wt 125 lb 9.6 oz (57 kg)   SpO2 99%   BMI 19.27 kg/m   Wt Readings from Last 3 Encounters:  01/23/24 125 lb 9.6 oz (57 kg)  05/15/23 121 lb 6.4 oz (55.1 kg)  10/12/22 130 lb 4.8 oz (59.1 kg)    Physical Exam Vitals and nursing note reviewed.  Constitutional:      General: He is  awake. He is not in acute distress.    Appearance: He is well-developed and well-groomed. He is not ill-appearing or toxic-appearing.  HENT:     Head: Normocephalic.     Right Ear: Hearing and external ear normal.     Left Ear: Hearing and external ear normal.  Eyes:     General: Lids are normal.     Extraocular Movements: Extraocular movements intact.     Conjunctiva/sclera: Conjunctivae normal.  Neck:     Thyroid: No thyromegaly.     Vascular: No carotid bruit.  Cardiovascular:     Rate and Rhythm: Normal rate and regular rhythm. No extrasystoles are present.    Chest Wall: PMI is not displaced.     Heart sounds: Normal heart sounds. No murmur heard.    No gallop.     Comments: Discomfort to lateral chest lower rib area reported, no grimacing with palpation of area and no masses noted.  No rashes to area. Pulmonary:     Effort: Pulmonary effort is normal. No accessory muscle usage or respiratory distress.     Breath sounds: Normal breath sounds. No decreased breath sounds, wheezing or rales.  Abdominal:     General: Bowel sounds are normal. There is no distension.     Palpations: Abdomen is soft.     Tenderness: There is no abdominal tenderness.  Musculoskeletal:     Cervical back: Full passive range of motion without pain.     Right lower leg: No edema.     Left lower leg: No edema.  Lymphadenopathy:     Cervical: No cervical adenopathy.  Skin:    General: Skin is warm.     Capillary Refill: Capillary refill takes less than 2 seconds.  Neurological:     Mental Status: He is alert and oriented to person, place, and time.     Deep Tendon Reflexes: Reflexes are normal and symmetric.     Reflex Scores:      Brachioradialis reflexes are 2+ on the right side and 2+ on the left side.      Patellar reflexes are 2+ on the right side and 2+ on the left side. Psychiatric:        Attention and Perception: Attention normal.        Mood and Affect: Mood normal.        Speech: Speech  normal.        Behavior: Behavior normal. Behavior is cooperative.  Thought Content: Thought content normal.   EKG My review and personal interpretation at Time: 1000 Indication: chest pain  Rate: 60 Rhythm: sinus Axis: normal Other: No nonspecific st abn, no stemi, no lvh -- some peaked T waves V2 to V5  Results for orders placed or performed in visit on 05/15/23  CBC with Differential/Platelet   Collection Time: 05/15/23  9:48 AM  Result Value Ref Range   WBC 3.3 (L) 3.4 - 10.8 x10E3/uL   RBC 4.69 4.14 - 5.80 x10E6/uL   Hemoglobin 13.5 13.0 - 17.7 g/dL   Hematocrit 24.4 01.0 - 51.0 %   MCV 87 79 - 97 fL   MCH 28.8 26.6 - 33.0 pg   MCHC 32.9 31.5 - 35.7 g/dL   RDW 27.2 53.6 - 64.4 %   Platelets 243 150 - 450 x10E3/uL   Neutrophils 45 Not Estab. %   Lymphs 45 Not Estab. %   Monocytes 7 Not Estab. %   Eos 3 Not Estab. %   Basos 0 Not Estab. %   Neutrophils Absolute 1.5 1.4 - 7.0 x10E3/uL   Lymphocytes Absolute 1.5 0.7 - 3.1 x10E3/uL   Monocytes Absolute 0.2 0.1 - 0.9 x10E3/uL   EOS (ABSOLUTE) 0.1 0.0 - 0.4 x10E3/uL   Basophils Absolute 0.0 0.0 - 0.2 x10E3/uL   Immature Granulocytes 0 Not Estab. %   Immature Grans (Abs) 0.0 0.0 - 0.1 x10E3/uL  Comprehensive metabolic panel   Collection Time: 05/15/23  9:48 AM  Result Value Ref Range   Glucose 83 70 - 99 mg/dL   BUN 9 6 - 20 mg/dL   Creatinine, Ser 0.34 0.76 - 1.27 mg/dL   eGFR 742 >59 DG/LOV/5.64   BUN/Creatinine Ratio 9 9 - 20   Sodium 140 134 - 144 mmol/L   Potassium 4.1 3.5 - 5.2 mmol/L   Chloride 104 96 - 106 mmol/L   CO2 24 20 - 29 mmol/L   Calcium 9.5 8.7 - 10.2 mg/dL   Total Protein 6.9 6.0 - 8.5 g/dL   Albumin 4.6 4.3 - 5.2 g/dL   Globulin, Total 2.3 1.5 - 4.5 g/dL   Bilirubin Total 0.4 0.0 - 1.2 mg/dL   Alkaline Phosphatase 51 44 - 121 IU/L   AST 15 0 - 40 IU/L   ALT 12 0 - 44 IU/L  Lipid Panel w/o Chol/HDL Ratio   Collection Time: 05/15/23  9:48 AM  Result Value Ref Range   Cholesterol, Total 122  100 - 199 mg/dL   Triglycerides 90 0 - 149 mg/dL   HDL 56 >33 mg/dL   VLDL Cholesterol Cal 17 5 - 40 mg/dL   LDL Chol Calc (NIH) 49 0 - 99 mg/dL  TSH   Collection Time: 05/15/23  9:48 AM  Result Value Ref Range   TSH 2.680 0.450 - 4.500 uIU/mL      Assessment & Plan:   Problem List Items Addressed This Visit       Other   WBC decreased   Noted on labs, initially in hospital setting, missed recheck of these -- monitor closely for trend and send to hematology as needed. Labs today.  Denies any B symptoms.       Other chest pain - Primary   For 3 weeks has been present, no other significant symptoms with this.  Notices more with deep breaths, touching area, or lying down.  Suspect more muscular in nature, however will perform imaging to further assess + obtain labs: CBC, CMP, TSH, Lipid, CRP,  ESR.  EKG overall reassuring with exception of some peaked T waves in V2 to V5.  ?hyperkalemia or lead placement.  Determine next steps after all testing returns.  Discussed strict ER precautions and is aware to go if any worsening symptoms present. - Trial steroid taper to assess if benefit as suspect some inflammation present      Relevant Orders   EKG 12-Lead   DG Chest 2 View   CBC with Differential/Platelet   Comprehensive metabolic panel with GFR   TSH   Sedimentation rate   C-reactive protein   Other Visit Diagnoses       Encounter for lipid screening for cardiovascular disease       Lipid panel on labs today.   Relevant Orders   Lipid Panel w/o Chol/HDL Ratio       Time: 25 minutes, >50% spent counseling/or care coordination   Follow up plan: Return in about 1 week (around 01/30/2024) for Chest pain.

## 2024-01-23 NOTE — Assessment & Plan Note (Addendum)
 For 3 weeks has been present, no other significant symptoms with this.  Notices more with deep breaths, touching area, or lying down.  Suspect more muscular in nature, however will perform imaging to further assess + obtain labs: CBC, CMP, TSH, Lipid, CRP, ESR.  EKG overall reassuring with exception of some peaked T waves in V2 to V5.  ?hyperkalemia or lead placement.  Determine next steps after all testing returns.  Discussed strict ER precautions and is aware to go if any worsening symptoms present. - Trial steroid taper to assess if benefit as suspect some inflammation present

## 2024-01-23 NOTE — Patient Instructions (Addendum)
 Get imaging at [facility]  Nonspecific Chest Pain Chest pain can be caused by many different conditions. Some causes of chest pain can be life-threatening. These will require treatment right away. Serious causes of chest pain include: Heart attack. A tear in the body's main blood vessel. Redness and swelling (inflammation) around your heart. Blood clot in your lungs. Other causes of chest pain may not be so serious. These include: Heartburn. Anxiety or stress. Damage to bones or muscles in your chest. Lung infections. Chest pain can feel like: Pain or discomfort in your chest. Crushing, pressure, aching, or squeezing pain. Burning or tingling. Dull or sharp pain that is worse when you move, cough, or take a deep breath. Pain or discomfort that is also felt in your back, neck, jaw, shoulder, or arm, or pain that spreads to any of these areas. It is hard to know whether your pain is caused by something that is serious or something that is not so serious. So it is important to see your doctor right away if you have chest pain. Follow these instructions at home: Medicines Take over-the-counter and prescription medicines only as told by your doctor. If you were prescribed an antibiotic medicine, take it as told by your doctor. Do not stop taking the antibiotic even if you start to feel better. Lifestyle  Rest as told by your doctor. Do not use any products that contain nicotine or tobacco, such as cigarettes, e-cigarettes, and chewing tobacco. If you need help quitting, ask your doctor. Do not drink alcohol. Make lifestyle changes as told by your doctor. These may include: Getting regular exercise. Ask your doctor what activities are safe for you. Eating a heart-healthy diet. A diet and nutrition specialist (dietitian) can help you to learn healthy eating options. Staying at a healthy weight. Treating diabetes or high blood pressure, if needed. Lowering your stress. Activities  such as yoga and relaxation techniques can help. General instructions Pay attention to any changes in your symptoms. Tell your doctor about them or any new symptoms. Avoid any activities that cause chest pain. Keep all follow-up visits as told by your doctor. This is important. You may need more testing if your chest pain does not go away. Contact a doctor if: Your chest pain does not go away. You feel depressed. You have a fever. Get help right away if: Your chest pain is worse. You have a cough that gets worse, or you cough up blood. You have very bad (severe) pain in your belly (abdomen). You pass out (faint). You have either of these for no clear reason: Sudden chest discomfort. Sudden discomfort in your arms, back, neck, or jaw. You have shortness of breath at any time. You suddenly start to sweat, or your skin gets clammy. You feel sick to your stomach (nauseous). You throw up (vomit). You suddenly feel lightheaded or dizzy. You feel very weak or tired. Your heart starts to beat fast, or it feels like it is skipping beats. These symptoms may be an emergency. Do not wait to see if the symptoms will go away. Get medical help right away. Call your local emergency services (911 in the [place] Do not drive yourself to the hospital. Summary Chest pain can be caused by many different conditions. The cause may be serious and need treatment right away. If you have chest pain, see your doctor right away. Follow your doctor's instructions for taking medicines and making lifestyle changes. Keep all follow-up visits as told by your  doctor. This includes visits for any further testing if your chest pain does not go away. Be sure to know the signs that show that your condition has become worse. Get help right away if you have these symptoms. This information is not intended to replace advice given to you by your health care provider. Make sure you discuss any questions you have with your health  care provider. Document Revised: 07/27/2022 Document Reviewed: 07/27/2022 Elsevier Patient Education  2024 [employer].

## 2024-01-23 NOTE — Assessment & Plan Note (Signed)
 Noted on labs, initially in hospital setting, missed recheck of these -- monitor closely for trend and send to hematology as needed. Labs today.  Denies any B symptoms.

## 2024-01-24 ENCOUNTER — Encounter: Payer: Self-pay | Admitting: Nurse Practitioner

## 2024-01-24 LAB — CBC WITH DIFFERENTIAL/PLATELET
Basophils Absolute: 0 10*3/uL (ref 0.0–0.2)
Basos: 0 %
EOS (ABSOLUTE): 0.2 10*3/uL (ref 0.0–0.4)
Eos: 6 %
Hematocrit: 41.8 % (ref 37.5–51.0)
Hemoglobin: 14 g/dL (ref 13.0–17.7)
Immature Grans (Abs): 0 10*3/uL (ref 0.0–0.1)
Immature Granulocytes: 0 %
Lymphocytes Absolute: 1.2 10*3/uL (ref 0.7–3.1)
Lymphs: 38 %
MCH: 30.2 pg (ref 26.6–33.0)
MCHC: 33.5 g/dL (ref 31.5–35.7)
MCV: 90 fL (ref 79–97)
Monocytes Absolute: 0.3 10*3/uL (ref 0.1–0.9)
Monocytes: 10 %
Neutrophils Absolute: 1.4 10*3/uL (ref 1.4–7.0)
Neutrophils: 46 %
Platelets: 248 10*3/uL (ref 150–450)
RBC: 4.63 x10E6/uL (ref 4.14–5.80)
RDW: 12.5 % (ref 11.6–15.4)
WBC: 3.1 10*3/uL — ABNORMAL LOW (ref 3.4–10.8)

## 2024-01-24 LAB — COMPREHENSIVE METABOLIC PANEL WITH GFR
ALT: 12 IU/L (ref 0–44)
AST: 17 IU/L (ref 0–40)
Albumin: 4.4 g/dL (ref 4.3–5.2)
Alkaline Phosphatase: 62 IU/L (ref 44–121)
BUN/Creatinine Ratio: 8 — ABNORMAL LOW (ref 9–20)
BUN: 6 mg/dL (ref 6–20)
Bilirubin Total: 0.2 mg/dL (ref 0.0–1.2)
CO2: 24 mmol/L (ref 20–29)
Calcium: 9.5 mg/dL (ref 8.7–10.2)
Chloride: 104 mmol/L (ref 96–106)
Creatinine, Ser: 0.78 mg/dL (ref 0.76–1.27)
Globulin, Total: 2.6 g/dL (ref 1.5–4.5)
Glucose: 90 mg/dL (ref 70–99)
Potassium: 4.4 mmol/L (ref 3.5–5.2)
Sodium: 141 mmol/L (ref 134–144)
Total Protein: 7 g/dL (ref 6.0–8.5)
eGFR: 129 mL/min/{1.73_m2} (ref >59)

## 2024-01-24 LAB — TSH: TSH: 1.5 u[IU]/mL (ref 0.450–4.500)

## 2024-01-24 LAB — LIPID PANEL W/O CHOL/HDL RATIO
Cholesterol, Total: 102 mg/dL (ref 100–199)
HDL: 57 mg/dL (ref >39)
LDL Chol Calc (NIH): 34 mg/dL (ref 0–99)
Triglycerides: 41 mg/dL (ref 0–149)
VLDL Cholesterol Cal: 11 mg/dL (ref 5–40)

## 2024-01-24 LAB — C-REACTIVE PROTEIN: CRP: <1 mg/L (ref 0–10)

## 2024-01-24 LAB — SEDIMENTATION RATE: Sed Rate: 2 mm/h (ref 0–15)

## 2024-01-24 NOTE — Progress Notes (Signed)
 Contacted via MyChart   Good afternoon Todd Oneill, your labs have returned.  CBC continues to show a lower white blood cell count.  I would like to check this again at visit 02/01/24 and check a couple other labs with it since this is ongoing.  We may end up sending you to hematology for further assessment in future.  Remainder of labs stable.  Any questions? Keep being amazing!!  Thank you for allowing me to participate in your care.  I appreciate you. Kindest regards, Andrian Urbach

## 2024-01-27 ENCOUNTER — Encounter: Payer: Self-pay | Admitting: Nurse Practitioner

## 2024-02-01 ENCOUNTER — Ambulatory Visit: Admitting: Nurse Practitioner

## 2024-02-02 NOTE — Patient Instructions (Incomplete)
 Chest Wall Pain Chest wall pain is pain in or around the bones and muscles of your chest. Sometimes, an injury causes this pain. Excessive coughing or overuse of arm and chest muscles may also cause chest wall pain. Sometimes, the cause may not be known. This pain may take several weeks or longer to get better. Follow these instructions at home: Managing pain, stiffness, and swelling  If directed, put ice on the painful area: Put ice in a plastic bag. Place a towel between your skin and the bag. Leave the ice on for 20 minutes, 2-3 times per day. Activity Rest as told by your health care provider. Avoid activities that cause pain. These include any activities that use your chest muscles or your abdominal and side muscles to lift heavy items. Ask your health care provider what activities are safe for you. General instructions  Take over-the-counter and prescription medicines only as told by your health care provider. Do not use any products that contain nicotine or tobacco, such as cigarettes, e-cigarettes, and chewing tobacco. These can delay healing after injury. If you need help quitting, ask your health care provider. Keep all follow-up visits as told by your health care provider. This is important. Contact a health care provider if: You have a fever. Your chest pain becomes worse. You have new symptoms. Get help right away if: You have nausea or vomiting. You feel sweaty or light-headed. You have a cough with mucus from your lungs (sputum) or you cough up blood. You develop shortness of breath. These symptoms may represent a serious problem that is an emergency. Do not wait to see if the symptoms will go away. Get medical help right away. Call your local emergency services (911 in the [place] Do not drive yourself to the hospital. Summary Chest wall pain is pain in or around the bones and muscles of your chest. Depending on the cause, it may be treated with ice, rest, medicines, and  avoiding activities that cause pain. Contact a health care provider if you have a fever, worsening chest pain, or new symptoms. Get help right away if you feel light-headed or you develop shortness of breath. These symptoms may be an emergency. This information is not intended to replace advice given to you by your health care provider. Make sure you discuss any questions you have with your health care provider. Document Revised: 09/04/2022 Document Reviewed: 09/04/2022 Elsevier Patient Education  2024 [employer].

## 2024-02-05 ENCOUNTER — Ambulatory Visit: Admitting: Nurse Practitioner

## 2024-03-11 ENCOUNTER — Emergency Department
Admission: EM | Admit: 2024-03-11 | Discharge: 2024-03-11 | Disposition: A | Discharge status: Home / Self Care | Attending: Emergency Medicine | Admitting: Emergency Medicine

## 2024-03-11 ENCOUNTER — Other Ambulatory Visit: Payer: Self-pay

## 2024-03-11 DIAGNOSIS — K0889 Other specified disorders of teeth and supporting structures: Secondary | ICD-10-CM | POA: Diagnosis present

## 2024-03-11 MED ORDER — AMOXICILLIN-POT CLAVULANATE 875-125 MG PO TABS: 1.0000 | ORAL_TABLET | Freq: Two times a day (BID) | ORAL | 0 refills | Status: AC

## 2024-03-11 NOTE — Discharge Instructions (Signed)
 I suspect you may be having a potential infection around your tooth.  I have sent an antibiotic to your pharmacy.  Please take this as prescribed and follow-up with dentistry for ongoing outpatient evaluation.  If you notice significant swelling to 1 side of the face that is worsening in the next 2 to 3 days, return for further evaluation.

## 2024-03-11 NOTE — ED Triage Notes (Signed)
 Pt to ED via POV from home. Pt reports right upper dental pain for a few days that is getting worse. Pt reports tried to get in with a dentist but not successful. Pt states pain 10/10.

## 2024-03-11 NOTE — ED Provider Notes (Signed)
 [facility] Provider Note    Event Date/Time   First MD Initiated Contact with Patient 03/11/24 1442     (approximate)   History   Dental Pain   HPI Todd Oneill is a 23 y.o. male presenting today for dental pain.  Patient states yesterday morning he had onset of pain in his right upper mouth.  He has tried over-the-counter medications like ibuprofen  and Tylenol  without significant relief.  Denies any significant facial swelling, fevers, difficulty swallowing.  Has not seen a dentist yet but is planning to see 1 later this week.     Physical Exam   Triage Vital Signs: ED Triage Vitals  Encounter Vitals Group     BP 03/11/24 1433 (!) 152/116     Girls Systolic BP Percentile --      Girls Diastolic BP Percentile --      Boys Systolic BP Percentile --      Boys Diastolic BP Percentile --      Pulse Rate 03/11/24 1433 63     Resp 03/11/24 1433 20     Temp 03/11/24 1433 98.3 F (36.8 C)     Temp src --      SpO2 03/11/24 1433 98 %     Weight 03/11/24 1432 127 lb (57.6 kg)     Height 03/11/24 1432 5' 7 (1.702 m)     Head Circumference --      Peak Flow --      Pain Score 03/11/24 1432 10     Pain Loc --      Pain Education --      Exclude from Growth Chart --     Most recent vital signs: Vitals:   03/11/24 1433  BP: (!) 152/116  Pulse: 63  Resp: 20  Temp: 98.3 F (36.8 C)  SpO2: 98%   I have reviewed the vital signs. General:  Awake, alert, no acute distress. Head:  Normocephalic, Atraumatic. EENT:  PERRL, EOMI, Oral mucosa pink and moist, Neck is supple.  No significant facial swelling noted.  Mild tenderness palpation over right maxillary molar without any signs of fluctuance or abscess Cardiovascular: Regular rate, 2+ distal pulses. Respiratory:  Normal respiratory effort, symmetrical expansion, no distress.   Extremities:  Moving all four extremities through full ROM without pain.   Neuro:  Alert and oriented.  Interacting  appropriately.   Skin:  Warm, dry, no rash.   Psych: Appropriate affect.    ED Results / Procedures / Treatments   Labs (all labs ordered are listed, but only abnormal results are displayed) Labs Reviewed - No data to display   EKG    RADIOLOGY    PROCEDURES:  Critical Care performed: No  Procedures   MEDICATIONS ORDERED IN ED: Medications - No data to display   IMPRESSION / MDM / ASSESSMENT AND PLAN / ED COURSE  I reviewed the triage vital signs and the nursing notes.                              Differential diagnosis includes, but is not limited to, dental pain, dental carry, dental infection, low suspicion for abscess  Patient's presentation is most consistent with acute, uncomplicated illness.  Patient is a 23 year old male presenting today for pain in the right maxillary molar.  No significant facial swelling noted.  Vital signs otherwise stable.  Does have tenderness palpation around that region but no obvious  fluctuance or noticeable abscess.  No indication for imaging at this time.  Will start on Augmentin  for potential dental infection versus gingivitis and have him follow-up with dentistry as planned.  Given strict return precautions.     FINAL CLINICAL IMPRESSION(S) / ED DIAGNOSES   Final diagnoses:  Pain, dental     Rx / DC Orders   ED Discharge Orders          Ordered    amoxicillin -clavulanate (AUGMENTIN ) 875-125 MG tablet  2 times daily        03/11/24 1448             Note:  This document was prepared using Dragon voice recognition software and may include unintentional dictation errors.   Kandee Orion, MD 03/11/24 1450

## 2024-04-30 ENCOUNTER — Emergency Department

## 2024-04-30 ENCOUNTER — Emergency Department
Admission: EM | Admit: 2024-04-30 | Discharge: 2024-04-30 | Disposition: A | Discharge status: Home / Self Care | Attending: Emergency Medicine | Admitting: Emergency Medicine

## 2024-04-30 ENCOUNTER — Encounter: Payer: Self-pay | Admitting: Emergency Medicine

## 2024-04-30 ENCOUNTER — Other Ambulatory Visit: Payer: Self-pay

## 2024-04-30 DIAGNOSIS — K610 Anal abscess: Secondary | ICD-10-CM | POA: Insufficient documentation

## 2024-04-30 LAB — CBC WITH DIFFERENTIAL/PLATELET
Abs Immature Granulocytes: 0.03 K/uL (ref 0.00–0.07)
Basophils Absolute: 0 K/uL (ref 0.0–0.1)
Basophils Relative: 0 %
Eosinophils Absolute: 0 K/uL (ref 0.0–0.5)
Eosinophils Relative: 1 %
HCT: 39.5 % (ref 39.0–52.0)
Hemoglobin: 13.1 g/dL (ref 13.0–17.0)
Immature Granulocytes: 0 %
Lymphocytes Relative: 22 %
Lymphs Abs: 1.6 K/uL (ref 0.7–4.0)
MCH: 29.7 pg (ref 26.0–34.0)
MCHC: 33.2 g/dL (ref 30.0–36.0)
MCV: 89.6 fL (ref 80.0–100.0)
Monocytes Absolute: 0.6 K/uL (ref 0.1–1.0)
Monocytes Relative: 8 %
Neutro Abs: 4.9 K/uL (ref 1.7–7.7)
Neutrophils Relative %: 69 %
Platelets: 239 K/uL (ref 150–400)
RBC: 4.41 MIL/uL (ref 4.22–5.81)
RDW: 12.4 % (ref 11.5–15.5)
WBC: 7.1 K/uL (ref 4.0–10.5)
nRBC: 0 % (ref 0.0–0.2)

## 2024-04-30 LAB — COMPREHENSIVE METABOLIC PANEL WITH GFR
ALT: 10 U/L (ref 0–44)
AST: 17 U/L (ref 15–41)
Albumin: 4.4 g/dL (ref 3.5–5.0)
Alkaline Phosphatase: 45 U/L (ref 38–126)
Anion gap: 9 (ref 5–15)
BUN: 9 mg/dL (ref 6–20)
CO2: 22 mmol/L (ref 22–32)
Calcium: 9.4 mg/dL (ref 8.9–10.3)
Chloride: 106 mmol/L (ref 98–111)
Creatinine, Ser: 0.88 mg/dL (ref 0.61–1.24)
GFR, Estimated: >60 mL/min (ref >60)
Glucose, Bld: 89 mg/dL (ref 70–99)
Potassium: 4 mmol/L (ref 3.5–5.1)
Sodium: 137 mmol/L (ref 135–145)
Total Bilirubin: 1 mg/dL (ref 0.0–1.2)
Total Protein: 7.5 g/dL (ref 6.5–8.1)

## 2024-04-30 MED ORDER — IOHEXOL 300 MG/ML  SOLN
100.0000 mL | Freq: Once | INTRAMUSCULAR | Status: AC | PRN
  Administered 2024-04-30: 100 mL via INTRAVENOUS

## 2024-04-30 MED ORDER — MORPHINE SULFATE (PF) 4 MG/ML IV SOLN
4.0000 mg | Freq: Once | INTRAVENOUS | Status: AC
  Administered 2024-04-30: 4 mg via INTRAVENOUS
  Filled 2024-04-30: qty 1

## 2024-04-30 MED ORDER — METRONIDAZOLE 500 MG PO TABS: 500.0000 mg | ORAL_TABLET | Freq: Two times a day (BID) | ORAL | 0 refills | Status: AC

## 2024-04-30 MED ORDER — LIDOCAINE HCL (PF) 1 % IJ SOLN
5.0000 mL | Freq: Once | INTRAMUSCULAR | Status: AC
  Administered 2024-04-30: 5 mL via INTRADERMAL
  Filled 2024-04-30: qty 5

## 2024-04-30 MED ORDER — ONDANSETRON HCL 4 MG/2ML IJ SOLN
4.0000 mg | Freq: Once | INTRAMUSCULAR | Status: AC
  Administered 2024-04-30: 4 mg via INTRAVENOUS
  Filled 2024-04-30: qty 2

## 2024-04-30 MED ORDER — CEPHALEXIN 500 MG PO CAPS: 500.0000 mg | ORAL_CAPSULE | Freq: Three times a day (TID) | ORAL | 0 refills | Status: AC

## 2024-04-30 MED ORDER — OXYCODONE-ACETAMINOPHEN 5-325 MG PO TABS: 1.0000 | ORAL_TABLET | ORAL | 0 refills | Status: AC | PRN

## 2024-04-30 NOTE — ED Triage Notes (Signed)
 Patient to ED via POV for rectal abscess. Pt reports hx of same. Came back 2 days ago.

## 2024-04-30 NOTE — ED Provider Notes (Signed)
 [facility] Provider Note    Event Date/Time   First MD Initiated Contact with Patient 04/30/24 1222     (approximate)   History   Abscess   HPI  Todd Oneill is a 23 y.o. 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  Last time he had surgery.  He was referred to a colorectal surgeon but did not follow up.  Presents today with pain that started 2 days ago.  States the area has enlarged but is not as large as yesterday.  No fever or chills.  Unknown if he has fistula     Physical Exam   Triage Vital Signs: ED Triage Vitals [04/30/24 1133]  Encounter Vitals Group     BP (!) 140/114     Girls Systolic BP Percentile      Girls Diastolic BP Percentile      Boys Systolic BP Percentile      Boys Diastolic BP Percentile      Pulse Rate 75     Resp 18     Temp 98.5 F (36.9 C)     Temp Source Oral     SpO2 99 %     Weight 127 lb (57.6 kg)     Height 5' 7 (1.702 m)     Head Circumference      Peak Flow      Pain Score 10     Pain Loc      Pain Education      Exclude from Growth Chart     Most recent vital signs: Vitals:   04/30/24 1133  BP: (!) 140/114  Pulse: 75  Resp: 18  Temp: 98.5 F (36.9 C)  SpO2: 99%     General: Awake, no distress.   CV:  Good peripheral perfusion.  Resp:  Normal effort. Abd:  No distention.   Other:  Rectal exam shows small abscess around the perianal area, area is tender to palpation, no large cellulitic area noted   ED Results / Procedures / Treatments   Labs (all labs ordered are listed, but only abnormal results are displayed) Labs Reviewed  COMPREHENSIVE METABOLIC PANEL WITH GFR  CBC WITH DIFFERENTIAL/PLATELET     EKG     RADIOLOGY CT abdomen pelvis IV contrast for perianal abscess    PROCEDURES:   .Incision and Drainage  Date/Time: 04/30/2024 4:22 PM  Performed by: Gasper Devere ORN, PA-C Authorized by: Gasper Devere ORN,  PA-C   Consent:    Consent obtained:  Verbal   Consent given by:  Patient   Risks, benefits, and alternatives were discussed: yes     Risks discussed:  Bleeding, incomplete drainage, pain, damage to other organs and infection   Alternatives discussed:  No treatment Universal protocol:    Procedure explained and questions answered to patient or proxy's satisfaction: yes     Immediately prior to procedure, a time out was called: yes     Patient identity confirmed:  Verbally with patient Location:    Type:  Abscess   Location:  Anogenital   Anogenital location:  Perirectal Pre-procedure details:    Skin preparation:  Povidone-iodine Sedation:    Sedation type:  None Anesthesia:    Anesthesia method:  Local infiltration   Local anesthetic:  Lidocaine  1% w/o epi Procedure type:    Complexity:  Simple Procedure details:    Ultrasound guidance: no     Needle aspiration: no  Drainage amount:  Copious   Wound treatment:  Wound left open Post-procedure details:    Procedure completion:  Tolerated well, no immediate complications Comments:     When we injected the numbing medicine the area of ruptured on its own causing the sac of pus to completely empty on its own.  Therefore did not make a actual incision.   Critical Care:  no Chief Complaint  Patient presents with   Abscess      MEDICATIONS ORDERED IN ED: Medications  lidocaine  (PF) (XYLOCAINE ) 1 % injection 5 mL (has no administration in time range)  morphine  (PF) 4 MG/ML injection 4 mg (4 mg Intravenous Given 04/30/24 1321)  ondansetron  (ZOFRAN ) injection 4 mg (4 mg Intravenous Given 04/30/24 1321)  iohexol  (OMNIPAQUE ) 300 MG/ML solution 100 mL (100 mLs Intravenous Contrast Given 04/30/24 1431)     IMPRESSION / MDM / ASSESSMENT AND PLAN / ED COURSE  I reviewed the triage vital signs and the nursing notes.                              Differential diagnosis includes, but is not limited to, perianal abscess, abscess,  fistula  Patient's presentation is most consistent with acute illness / injury with system symptoms.    Medications given: Morphine  4 mg IV, Zofran  4 mg IV  CT due to the history of perianal abscess with surgical drainage we will go ahead and do CT abdomen pelvis for perianal abscess.  Have concerns he could actually have a fistula.   Labs are reassuring  CT abdomen pelvis IV contrast shows a small perianal abscess, this was independently reviewed interpreted by me.  Do not feel we need to consult surgery as it is very superficial.  Procedure note for incision and drainage Gauze was applied to the area  I did explain everything to the patient.  Will place him on antibiotic, Keflex  and Flagyl .  He is to follow-up with his regular doctor.  Return emergency department if worsening.  He was given a work note.  Pain medication also prescribed.  Discharged stable condition.  FINAL CLINICAL IMPRESSION(S) / ED DIAGNOSES   Final diagnoses:  Perianal abscess     Rx / DC Orders   ED Discharge Orders          Ordered    cephALEXin  (KEFLEX ) 500 MG capsule  3 times daily        04/30/24 1617    metroNIDAZOLE  (FLAGYL ) 500 MG tablet  2 times daily        04/30/24 1617    oxyCODONE -acetaminophen  (PERCOCET) 5-325 MG tablet  Every 4 hours PRN        04/30/24 1617             Note:  This document was prepared using Dragon voice recognition software and may include unintentional dictation errors.    Gasper Devere ORN, PA-C 04/30/24 1624    Dorothyann Drivers, MD 04/30/24 1723

## 2024-05-01 ENCOUNTER — Encounter: Payer: Self-pay | Admitting: Nurse Practitioner

## 2024-05-11 NOTE — Patient Instructions (Incomplete)
 Healthy Eating, Adult Healthy eating may help you get and keep a healthy body weight, reduce the risk of chronic disease, and live a long and productive life. It is important to follow a healthy eating pattern. Your nutritional and calorie needs should be met mainly by different nutrient-rich foods. What are tips for following this plan? Reading food labels Read labels and choose the following: Reduced or low sodium products. Juices with 100% fruit juice. Foods with low saturated fats (<3 g per serving) and high polyunsaturated and monounsaturated fats. Foods with whole grains, such as whole wheat, cracked wheat, brown rice, and wild rice. Whole grains that are fortified with folic acid . This is recommended for females who are pregnant or who want to become pregnant. Read labels and do not eat or drink the following: Foods or drinks with added sugars. These include foods that contain brown sugar, corn sweetener, corn syrup, dextrose , fructose, glucose, high-fructose corn syrup, honey, invert sugar, lactose, malt syrup, maltose, molasses, raw sugar, sucrose, trehalose, or turbinado sugar. Limit your intake of added sugars to less than 10% of your total daily calories. Do not eat more than the following amounts of added sugar per day: 6 teaspoons (25 g) for females. 9 teaspoons (38 g) for males. Foods that contain processed or refined starches and grains. Refined grain products, such as white flour, degermed cornmeal, white bread, and white rice. Shopping Choose nutrient-rich snacks, such as vegetables, whole fruits, and nuts. Avoid high-calorie and high-sugar snacks, such as potato chips, fruit snacks, and candy. Use oil-based dressings and spreads on foods instead of solid fats such as butter, margarine, sour cream, or cream cheese. Limit pre-made sauces, mixes, and instant products such as flavored rice, instant noodles, and ready-made pasta. Try more plant-protein sources, such as tofu,  tempeh, black beans, edamame, lentils, nuts, and seeds. Explore eating plans such as the Mediterranean diet or vegetarian diet. Try heart-healthy dips made with beans and healthy fats like hummus and guacamole. Vegetables go great with these. Cooking Use oil to saut or stir-fry foods instead of solid fats such as butter, margarine, or lard. Try baking, boiling, grilling, or broiling instead of frying. Remove the fatty part of meats before cooking. Steam vegetables in water  or broth. Meal planning  At meals, imagine dividing your plate into fourths: One-half of your plate is fruits and vegetables. One-fourth of your plate is whole grains. One-fourth of your plate is protein, especially lean meats, poultry, eggs, tofu, beans, or nuts. Include low-fat dairy as part of your daily diet. Lifestyle Choose healthy options in all settings, including home, work, school, restaurants, or stores. Prepare your food safely: Wash your hands after handling raw meats. Where you prepare food, keep surfaces clean by regularly washing with hot, soapy water . Keep raw meats separate from ready-to-eat foods, such as fruits and vegetables. Cook seafood, meat, poultry, and eggs to the recommended temperature. Get a food thermometer. Store foods at safe temperatures. In general: Keep cold foods at 34F (4.4C) or below. Keep hot foods at 134F (60C) or above. Keep your freezer at [facility] (-17.8C) or below. Foods are not safe to eat if they have been between the temperatures of 40-134F (4.4-60C) for more than 2 hours. What foods should I eat? Fruits Aim to eat 1-2 cups of fresh, canned (in natural juice), or frozen fruits each day. One cup of fruit equals 1 small apple, 1 large banana, 8 large strawberries, 1 cup (237 g) canned fruit,  cup (82 g) dried fruit,  or 1 cup (240 mL) 100% juice. Vegetables Aim to eat 2-4 cups of fresh and frozen vegetables each day, including different varieties and colors. One cup  of vegetables equals 1 cup (91 g) broccoli or cauliflower florets, 2 medium carrots, 2 cups (150 g) raw, leafy greens, 1 large tomato, 1 large bell pepper, 1 large sweet potato, or 1 medium white potato. Grains Aim to eat 5-10 ounce-equivalents of whole grains each day. Examples of 1 ounce-equivalent of grains include 1 slice of bread, 1 cup (40 g) ready-to-eat cereal, 3 cups (24 g) popcorn, or  cup (93 g) cooked rice. Meats and other proteins Try to eat 5-7 ounce-equivalents of protein each day. Examples of 1 ounce-equivalent of protein include 1 egg,  oz nuts (12 almonds, 24 pistachios, or 7 walnut halves), 1/4 cup (90 g) cooked beans, 6 tablespoons (90 g) hummus or 1 tablespoon (16 g) peanut butter. A cut of meat or fish that is the size of a deck of cards is about 3-4 ounce-equivalents (85 g). Of the protein you eat each week, try to have at least 8 sounce (227 g) of seafood. This is about 2 servings per week. This includes salmon, trout, herring, sardines, and anchovies. Dairy Aim to eat 3 cup-equivalents of fat-free or low-fat dairy each day. Examples of 1 cup-equivalent of dairy include 1 cup (240 mL) milk, 8 ounces (250 g) yogurt, 1 ounces (44 g) natural cheese, or 1 cup (240 mL) fortified soy milk. Fats and oils Aim for about 5 teaspoons (21 g) of fats and oils per day. Choose monounsaturated fats, such as canola and olive oils, mayonnaise made with olive oil or avocado oil, avocados, peanut butter, and most nuts, or polyunsaturated fats, such as sunflower, corn, and soybean oils, walnuts, pine nuts, sesame seeds, sunflower seeds, and flaxseed. Beverages Aim for 6 eight-ounce glasses of water  per day. Limit coffee to 3-5 eight-ounce cups per day. Limit caffeinated beverages that have added calories, such as soda and energy drinks. If you drink alcohol: Limit how much you have to: 0-1 drink a day if you are male. 0-2 drinks a day if you are male. Know how much alcohol is in your drink.  In the [place] one drink is one 12 oz bottle of beer (355 mL), one 5 oz glass of wine (148 mL), or one 1 oz glass of hard liquor (44 mL). Seasoning and other foods Try not to add too much salt to your food. Try using herbs and spices instead of salt. Try not to add sugar to food. This information is based on [place] nutrition guidelines. To learn more, visit [employer]. Exact amounts may vary. You may need different amounts. This information is not intended to replace advice given to you by your health care provider. Make sure you discuss any questions you have with your health care provider. Document Revised: 06/12/2022 Document Reviewed: 06/12/2022 Elsevier Patient Education  2024 [employer].

## 2024-05-16 ENCOUNTER — Encounter: Payer: Self-pay | Admitting: Nurse Practitioner

## 2024-05-16 DIAGNOSIS — D72818 Other decreased white blood cell count: Secondary | ICD-10-CM

## 2024-05-16 DIAGNOSIS — Z0000 Encounter for general adult medical examination without abnormal findings: Secondary | ICD-10-CM

## 2024-05-16 DIAGNOSIS — Z13220 Encounter for screening for lipoid disorders: Secondary | ICD-10-CM
